# Patient Record
Sex: Female | Born: 1970 | Race: White | Hispanic: No | Marital: Single | Smoking: Current every day smoker
Health system: Southern US, Community
[De-identification: ages and names within clinical notes are randomized; demographics above are authoritative.]

## PROBLEM LIST (undated history)

## (undated) DIAGNOSIS — E119 Type 2 diabetes mellitus without complications: Secondary | ICD-10-CM

## (undated) DIAGNOSIS — T7840XA Allergy, unspecified, initial encounter: Secondary | ICD-10-CM

## (undated) DIAGNOSIS — I10 Essential (primary) hypertension: Secondary | ICD-10-CM

## (undated) DIAGNOSIS — E785 Hyperlipidemia, unspecified: Secondary | ICD-10-CM

## (undated) HISTORY — PX: WISDOM TOOTH EXTRACTION: SHX21

## (undated) HISTORY — DX: Allergy, unspecified, initial encounter: T78.40XA

## (undated) HISTORY — PX: TONSILLECTOMY: SUR1361

## (undated) HISTORY — DX: Hyperlipidemia, unspecified: E78.5

## (undated) HISTORY — DX: Essential (primary) hypertension: I10

## (undated) HISTORY — DX: Type 2 diabetes mellitus without complications: E11.9

---

## 1999-09-15 ENCOUNTER — Other Ambulatory Visit: Admission: RE | Admit: 1999-09-15 | Discharge: 1999-09-15 | Payer: Self-pay | Admitting: Obstetrics and Gynecology

## 2000-12-01 ENCOUNTER — Other Ambulatory Visit: Admission: RE | Admit: 2000-12-01 | Discharge: 2000-12-01 | Payer: Self-pay | Admitting: Obstetrics and Gynecology

## 2002-01-05 ENCOUNTER — Other Ambulatory Visit: Admission: RE | Admit: 2002-01-05 | Discharge: 2002-01-05 | Payer: Self-pay | Admitting: Obstetrics and Gynecology

## 2003-01-31 ENCOUNTER — Other Ambulatory Visit: Admission: RE | Admit: 2003-01-31 | Discharge: 2003-01-31 | Payer: Self-pay | Admitting: Obstetrics and Gynecology

## 2004-06-02 ENCOUNTER — Ambulatory Visit: Payer: Self-pay | Admitting: Internal Medicine

## 2004-06-06 ENCOUNTER — Other Ambulatory Visit: Admission: RE | Admit: 2004-06-06 | Discharge: 2004-06-06 | Payer: Self-pay | Admitting: Obstetrics and Gynecology

## 2004-07-25 ENCOUNTER — Ambulatory Visit: Payer: Self-pay | Admitting: Internal Medicine

## 2004-09-01 ENCOUNTER — Ambulatory Visit: Payer: Self-pay | Admitting: Internal Medicine

## 2004-11-03 ENCOUNTER — Ambulatory Visit: Payer: Self-pay | Admitting: Internal Medicine

## 2004-12-01 ENCOUNTER — Ambulatory Visit: Payer: Self-pay | Admitting: Internal Medicine

## 2004-12-02 ENCOUNTER — Inpatient Hospital Stay (HOSPITAL_COMMUNITY): Admission: EM | Admit: 2004-12-02 | Discharge: 2004-12-06 | Payer: Self-pay | Admitting: Emergency Medicine

## 2004-12-02 ENCOUNTER — Ambulatory Visit: Payer: Self-pay | Admitting: Internal Medicine

## 2004-12-04 ENCOUNTER — Ambulatory Visit: Payer: Self-pay | Admitting: Gastroenterology

## 2004-12-09 ENCOUNTER — Ambulatory Visit: Payer: Self-pay | Admitting: Internal Medicine

## 2005-01-06 ENCOUNTER — Ambulatory Visit: Payer: Self-pay | Admitting: Internal Medicine

## 2006-10-28 IMAGING — US US RENAL
1 series · 14 of 25 positions shown · non-contrast
Comparison: none

CLINICAL DATA: Acute renal failure

RENAL/URINARY TRACT ULTRASOUND:
TECHNIQUE: Complete ultrasound examination of the urinary tract was performed
including evaluation of the kidneys, renal collecting systems, and urinary
bladder.

[Series 1: unknown · 0.38mm/px · 14 of 33 slices shown]
[im 1/33]
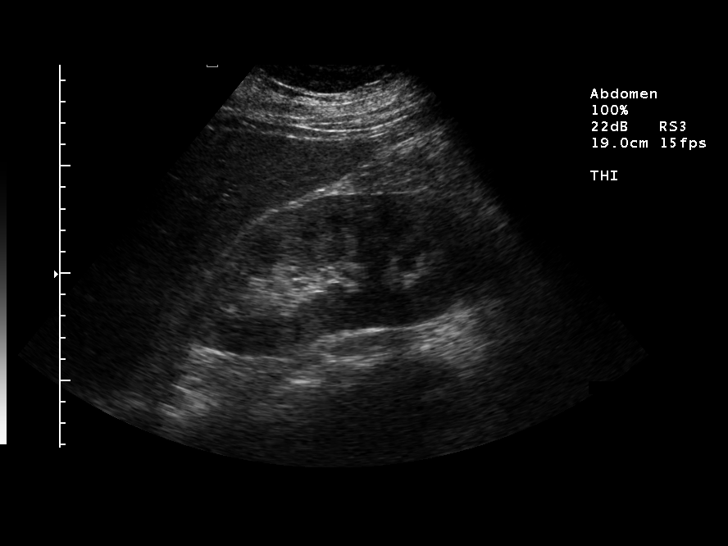
[im 3/33]
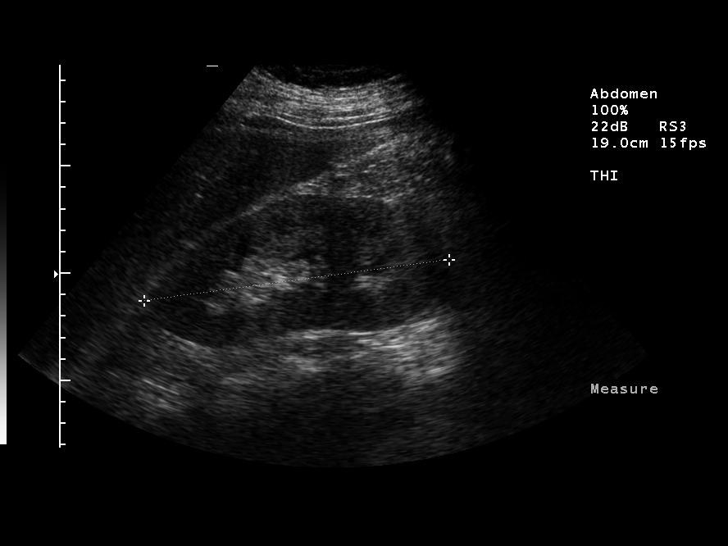
[im 6/33]
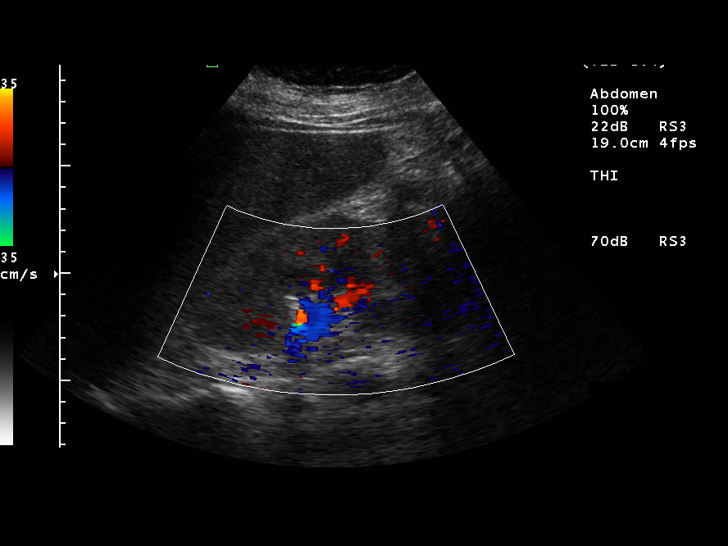
[im 9/33]
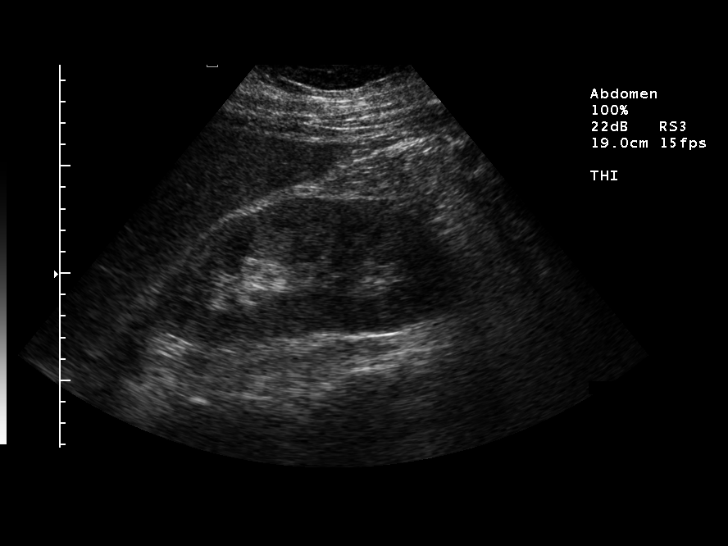
[im 11/33]
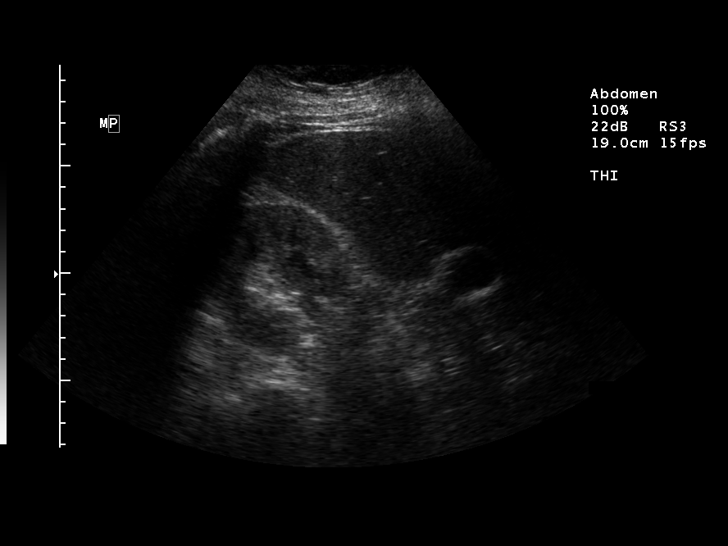
[im 13/33]
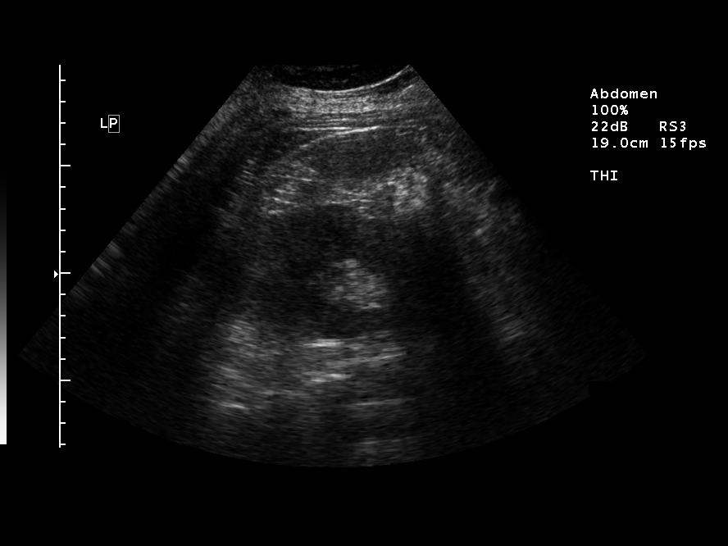
[im 15/33]
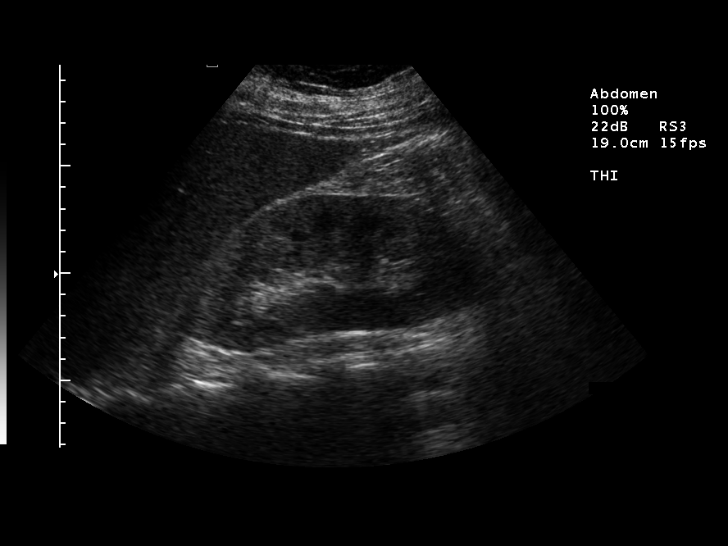
[im 18/33]
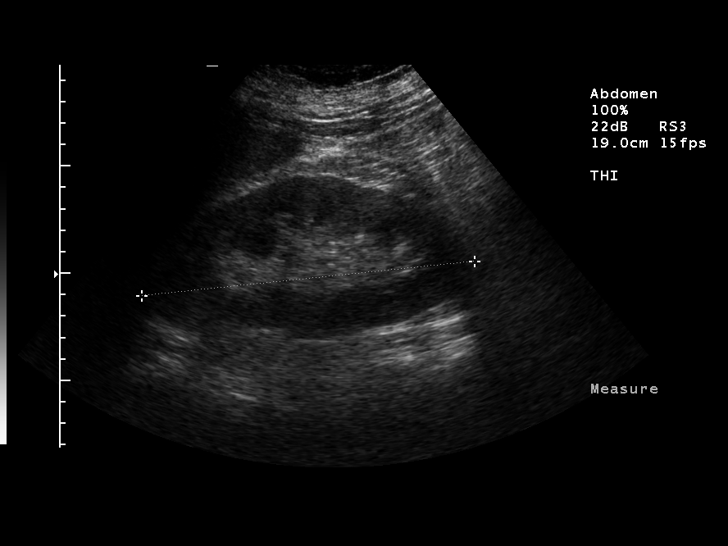
[im 21/33]
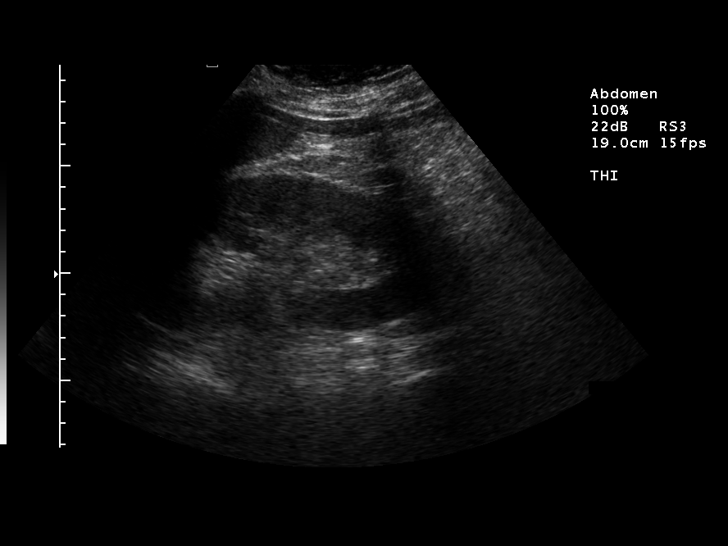
[im 22/33]
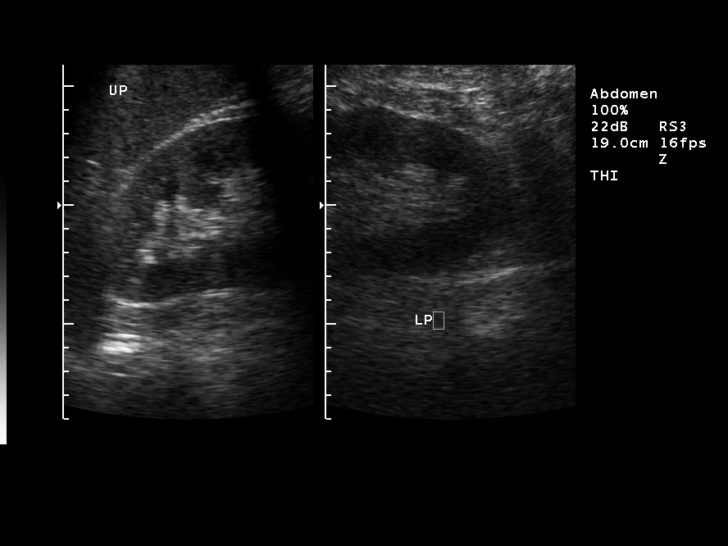
[im 25/33]
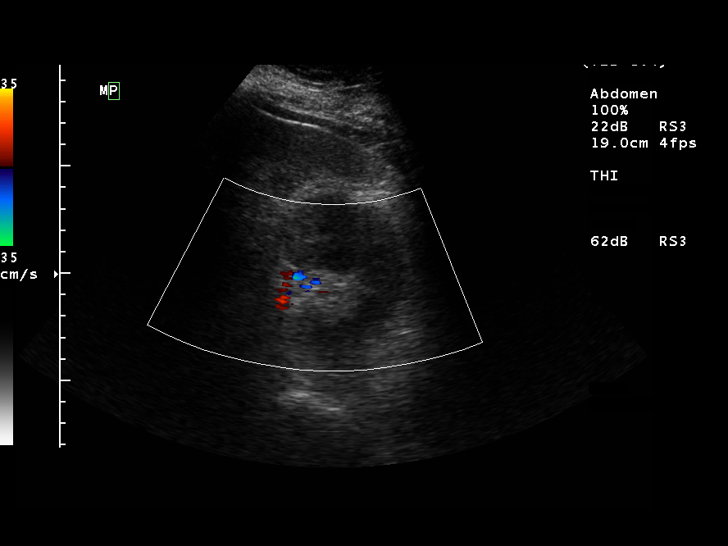
[im 27/33]
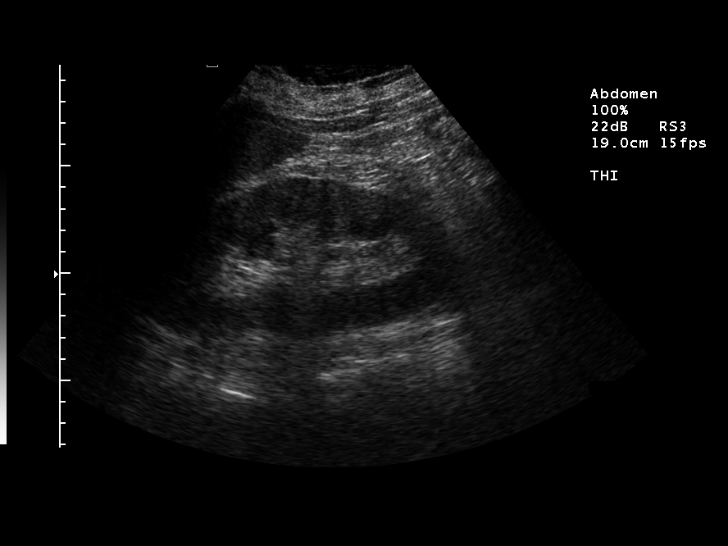
[im 30/33]
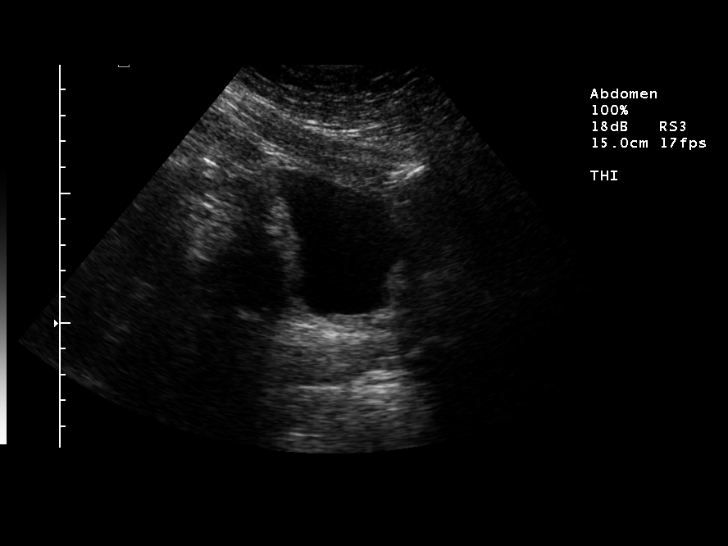
[im 33/33]
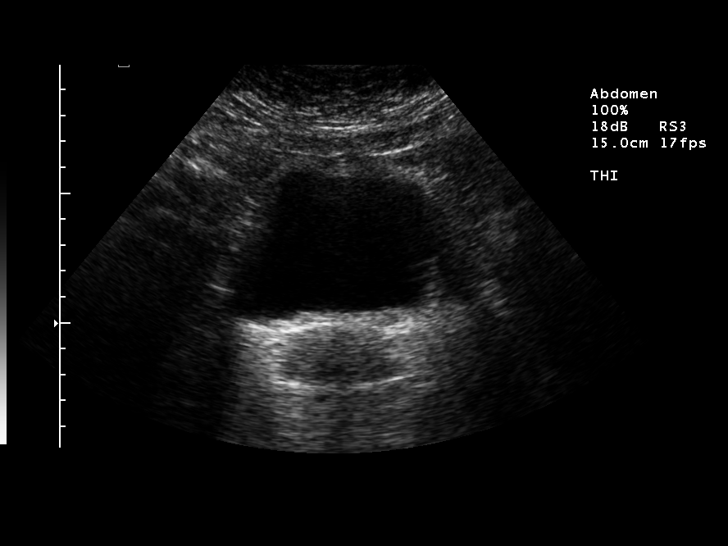

[14 of 25 positions shown; findings below may reference images not displayed]

FINDINGS: Both kidneys are within normal limits in size and parenchymal
echogenicity.  No renal parenchymal lesions are seen.  There is no evidence of
hydronephrosis.  No perinephric abnormalities are seen.

Images of the urinary bladder are unremarkable for the degree of bladder
filling.
IMPRESSION: Normal study.

## 2007-03-22 DIAGNOSIS — R51 Headache: Secondary | ICD-10-CM

## 2007-03-22 DIAGNOSIS — R519 Headache, unspecified: Secondary | ICD-10-CM | POA: Insufficient documentation

## 2007-03-22 DIAGNOSIS — I10 Essential (primary) hypertension: Secondary | ICD-10-CM

## 2008-02-21 ENCOUNTER — Emergency Department (HOSPITAL_COMMUNITY): Admission: EM | Admit: 2008-02-21 | Discharge: 2008-02-21 | Payer: Self-pay | Admitting: Emergency Medicine

## 2010-10-10 NOTE — Discharge Summary (Signed)
[name], [name]             ACCOUNT NO.:  [id number]   MEDICAL RECORD NO.:  [id number]          PATIENT TYPE:  INP   LOCATION:  1615                         FACILITY:  [facility]   PHYSICIAN:  Rene Paci, M.D. LHCDATE OF BIRTH:  1970-12-23   DATE OF ADMISSION:  12/02/2004  DATE OF DISCHARGE:  12/06/2004                                 DISCHARGE SUMMARY   DISCHARGE DIAGNOSES:  1.  Escherichia coli urinary tract infection with sepsis, one of two blood      cultures.  Continue oral antibiotics.  2.  Nausea, vomiting and diarrhea secondary to above, resolved.  Negative      gastrointestinal workup for other pathogens.  3.  Dehydration, secondary to above, improved.  4.  Acute renal failure, secondary to dehydration,  improved.  Discharge      creatinine 2.0  5.  Iron deficiency anemia.  Discharge hemoglobin 10.8.  Continue iron      supplementation by mouth.  6.  Hypokalemia, secondary to gastrointestinal loss, improved.  7.  Headache, possible migraine quality, improved.  8.  History of hypertension holding off on hydrochlorothiazide secondary to      above issues until further evaluation.   DISCHARGE MEDICATIONS:  1.  Cipro 500 mg p.o. b.i.d. x7 additional days to complete 10-day course.  2.  Vicodin 5/500 one to two p.o. q.6h. p.r.n. headache.  3.  Advil over-the-counter as needed for headache.  4.  Iron 325 mg p.o. b.i.d.   DISPOSITION:  The patient is discharged home in medically stable condition.  She is tolerating p.o. nutrition, hydration and medications.  She  understands diagnoses and plans for follow-up.   FOLLOW UP:  Hospital follow-up is to be arranged by the patient for next  week with her primary care physician, Dr. Gordy Mullins, to review  headache symptoms, resolution of renal failure and consider other treatment  for hypertension, if needed.  Also, follow-up on iron deficiency anemia on a  long-term basis.   HOSPITAL COURSE:  Problem 1.  FEVER  WITH NAUSEA, VOMITING AND DIARRHEA:  The  patient is a 40 year old woman who came to the emergency room day of  admission complaining of these GI symptoms associated with weakness and  prostration.  Because of high grade fever of 103.2, significant dehydration  as evidenced by the renal failure, creatinine of 3.3, potassium of 2.6 and a  heart rate in the 150s, she was admitted for IV fluids and hydration.  Although, initially presumed to be related to gastroenteritis, her urine  culture returned with E-coli as did blood cultures returned with 1/2 gram  negative rods, later identified also as the same E-coli.  Because of the  UTI, she was begun on IV antibiotics of Cipro and Flagyl for possible GI  pathogens as well as urinary tract on the second day of her hospitalization  and quickly defervesced.  Her GI symptoms also subsided within the second  day, although a GI consult was obtained to ensure there were no other  underlying GI issues.  No fecal white cells and stool culture showed no  pathogens as  stated above issues.  It is felt that her GI symptoms were  related to underlying E. Coli UTI with sepsis.  The patient now tolerating  p.o. Cipro.  Hydration status markedly improved and feels ready for  discharge home.   Problem 2.  ESCHERICHIA COLI URINARY TRACT INFECTION WITH SEPSIS:  Please  see problem above.   Problem 3.  ACUTE RENAL FAILURE SECONDARY TO DEHYDRATION:  As stated above,  the patient was markedly dehydrated because of GI losses likely also  exacerbated by HCTZ taken prior to admission for hypertension.  She was  aggressively hydrated with slow improvement in her creatinine.  Renal  ultrasound was checked that showed no evidence of hydroobstruction with  normal size kidneys.  With hydration, her creatinine has gradually improved  to a discharge level of 2.0.  Of note, her creatinine was normal at 0.6  earlier this year in March 2006, on routine lab work as an outpatient.   It  is anticipated she will return to this level, although outpatient monitoring  will be undertaken by her primary M.D.   Problem 4.  IRON DEFICIENCY ANEMIA:  With hydration the patient's  hemoglobin.  There was no evidence of acute GI bleeding iron levels were  checked and less than 10.  B12 and folate levels were normal.  As this is  still a menstruating, young woman we have apparent explanation for the iron  deficiency.  The patient has been begun on oral iron at time of discharge  and outpatient follow-up with primary MD will be pursued with further  evaluation by GI for gynecology on an as-needed basis.   Problem 5.  HISTORY OF HYPERTENSION:  The patient has been normotensive  during this hospitalization likely in part related to volume depletion.  Because of underlying issues with dehydration and renal insufficiency, the  patient has been instructed to hold her HCTZ until this time being when she  is reevaluated by primary M.D. to consider resuming this medication or other  antihypertensive on an as-needed basis.       VL/MEDQ  D:  12/06/2004  T:  12/06/2004  Job:  [id number]

## 2010-10-10 NOTE — H&P (Signed)
NAMEVINETTA, BRACH             ACCOUNT NO.:  [id number]   MEDICAL RECORD NO.:  [id number]          PATIENT TYPE:  EMS   LOCATION:  ED                           [facility]AC[facility]   PHYSICIAN:  Corwin Levins, M.D. LHCDATE OF BIRTH:  17-Dec-1970   DATE OF ADMISSION:  12/02/2004  DATE OF DISCHARGE:                                HISTORY & PHYSICAL   CHIEF COMPLAINT:  Persistent nausea, vomiting, fever, and diarrhea for the  last 3-4 days.   HISTORY OF PRESENT ILLNESS:  Ms. Noori is a 40 year old white female with  sudden onset of acute gastroenteritis with vigorous nausea and vomiting and  nonbloody stools, crampy abdominal pain and fever.  She saw Dr. Amador Cunas  on July 10th with severe nausea.  Meds given but overall symptoms worsened  and not controlled.  Now unable to take p.o. today.  Increasing weakness,  frustration.  She comes to the emergency room with dizziness, fever of 103.2  and severe tachycardia with sinus rhythm of approximately 150 beats per  minute.  She is some better with IV fluids in the ER.  Creatinine elevated  at 3.3.  This seems to be a new onset of renal insufficiency and now  admitted for symptomatic control of dehydration and acute renal  insufficiency.   PAST MEDICAL HISTORY:  Hypertension.   PAST SURGICAL HISTORY:  Status post T&A.   ALLERGIES:  No known drug allergies.   CURRENT MEDICATIONS:  1.  BCPs.  2.  HCTZ 25 mg p.o. daily.   SOCIAL HISTORY:  No tobacco.  Occasional alcohol.  Single.  No children.   FAMILY HISTORY:  Diabetes.   REVIEW OF SYSTEMS:  Noncontributory.   PHYSICAL EXAMINATION:  VITAL SIGNS:  Blood pressure 124/81, temperature  103.2, heart rate 153, respiratory rate 15.  GENERAL:  Ms. Grigg is a 40 year old white female.  HEENT:  She appears somewhat flushed.  Sclerae are clear.  Pharynx with mild  erythema.  NECK:  Without lymphadenopathy, JVD, thyromegaly.  LUNGS:  No rales or wheezes.  HEART:  Tachycardic.   Otherwise regular rate and rhythm with no murmur.  ABDOMEN:  Soft and nontender.  Positive bowel sounds except for mild  tenderness in the left upper and left lower quadrant.  There is no guarding  or rebound.  EXTREMITIES:  No edema.  No rash.   LABS:  White blood cell count 8.5, hemoglobin 12.3.  Electrolytes include  sodium 131, potassium 2.6, chloride 97, bicarb 19, BUN 35, creatinine 3.3,  glucose 130.  Urine pregnancy test negative.   ASSESSMENT/PLAN:  1.  Febrile illness with prostration, nausea, vomiting, diarrhea.  She will      be admitted for IV fluids, symptomatic control.  We will check blood and      urine cultures but overall, the patient just seems viral.  Will hold      antibiotics at start unless culture is positive or other symptoms      develop.  2.  Acute renal insufficiency, likely secondary as above.  Treat as above.      Also check renal ultrasound.  Follow BUN and creatinine as above.  3.  Hypokalemia:  Likely secondary to nausea and vomiting and      gastrointestinal disturbance.  We will replace IV.  4.  Hypertension:  Hold HCTZ.  Restart later.       JWJ/MEDQ  D:  12/02/2004  T:  12/02/2004  Job:  [id number]   cc:   Gordy Savers, M.D. [facility]

## 2011-02-23 LAB — URINALYSIS, ROUTINE W REFLEX MICROSCOPIC
Glucose, UA: 100 — AB
Nitrite: POSITIVE — AB
Urobilinogen, UA: 0.2

## 2011-02-23 LAB — URINE MICROSCOPIC-ADD ON

## 2012-08-29 ENCOUNTER — Ambulatory Visit (INDEPENDENT_AMBULATORY_CARE_PROVIDER_SITE_OTHER): Payer: Medicare HMO | Admitting: Internal Medicine

## 2012-08-29 ENCOUNTER — Ambulatory Visit: Payer: Medicare HMO

## 2012-08-29 VITALS — BP 105/79 | HR 101 | Temp 99.0°F | Resp 18 | Ht 68.0 in | Wt 265.0 lb

## 2012-08-29 DIAGNOSIS — J111 Influenza due to unidentified influenza virus with other respiratory manifestations: Secondary | ICD-10-CM

## 2012-08-29 DIAGNOSIS — J101 Influenza due to other identified influenza virus with other respiratory manifestations: Secondary | ICD-10-CM

## 2012-08-29 DIAGNOSIS — R05 Cough: Secondary | ICD-10-CM

## 2012-08-29 LAB — POCT CBC
HCT, POC: 47.3 % (ref 37.7–47.9)
Hemoglobin: 15.2 g/dL (ref 12.2–16.2)
MCH, POC: 28 pg (ref 27–31.2)
MCV: 87.2 fL (ref 80–97)
MID (cbc): 0.3 (ref 0–0.9)
MPV: 9.2 fL (ref 0–99.8)
POC LYMPH PERCENT: 16.5 %L (ref 10–50)
Platelet Count, POC: 194 10*3/uL (ref 142–424)
RBC: 5.43 M/uL (ref 4.04–5.48)
RDW, POC: 14 %

## 2012-08-29 MED ORDER — OSELTAMIVIR PHOSPHATE 75 MG PO CAPS: 75.0000 mg | ORAL_CAPSULE | Freq: Two times a day (BID) | ORAL | Status: DC

## 2012-08-29 MED ORDER — HYDROCODONE-HOMATROPINE 5-1.5 MG/5ML PO SYRP: ORAL_SOLUTION | ORAL | Status: DC

## 2012-08-29 MED ORDER — IPRATROPIUM BROMIDE 0.06 % NA SOLN
2.0000 | Freq: Three times a day (TID) | NASAL | Status: DC
Start: 2012-08-29 — End: 2013-04-06

## 2012-08-29 NOTE — Progress Notes (Signed)
Patient ID: [name] MRN: [id number], DOB: 05-29-1970, 42 y.o. Date of Encounter: 08/29/2012, 9:25 PM  Primary Physician: No primary provider on file.  Chief Complaint: Nasal congestion, sore throat, cough, and chills, for 2 days  HPI: 42 y.o. female with history below presents with nasal congestion, rhinorrhea, sore throat, otalgia, and cough for 2 days. Afebrile, but with chills. Cough is not productive and not associated with time of day. No shortness of breath or wheezing. Ears will pop then hearing will again become muffled again shortly thereafter. Notes some post nasal drip. Multiple sick contacts at work. No influenza vaccine this year. Has tried DayQuil and Aleave. Last dose around 3 PM today. No GI complaints. No sedentary periods, leg trauma, history of cancer, or current tobacco use.     Past Medical History  Diagnosis Date  . Allergy      Home Meds: Prior to Admission medications   Medication Sig Start Date End Date Taking? Authorizing Provider  etonogestrel (IMPLANON) 68 MG IMPL implant Inject 1 each into the skin once.   Yes Historical Provider, MD  losartan (COZAAR) 50 MG tablet Take 25 mg by mouth daily.   Yes Historical Provider, MD  nebivolol (BYSTOLIC) 2.5 MG tablet Take 2.5 mg by mouth daily.   Yes Historical Provider, MD    Allergies: No Known Allergies  History   Social History  . Marital Status: Married    Spouse Name: N/A    Number of Children: N/A  . Years of Education: N/A   Occupational History  . Not on file.   Social History Main Topics  . Smoking status: Former Games developer  . Smokeless tobacco: Not on file  . Alcohol Use: Yes  . Drug Use: No  . Sexually Active: Yes    Birth Control/ Protection: Implant   Other Topics Concern  . Not on file   Social History Narrative  . No narrative on file     Review of Systems: Constitutional: positive for chills and fatigue. negative for fever, night sweats, or weight changes  HEENT: see  above Cardiovascular: negative for chest pain or palpitations Respiratory: positive for cough. negative for hemoptysis, dyspnea, wheezing, shortness of breath Abdominal: negative for abdominal pain, nausea, vomiting, or diarrhea Dermatological: negative for rash Neurologic: positive for headache. negative for dizziness, or syncope   Physical Exam: Blood pressure 105/79, pulse 101, temperature 99 F (37.2 C), temperature source Oral, resp. rate 18, height 5\' 8"  (1.727 m), weight 265 lb (120.203 kg), last menstrual period 08/29/2012, SpO2 99.00%., Body mass index is 40.3 kg/(m^2). Pulse recheck at 82 BMP.  General: Well developed, well nourished, in no acute distress. Head: Normocephalic, atraumatic, eyes without discharge, sclera non-icteric, nares are without discharge. Bilateral auditory canals clear, TM's are without perforation, pearly grey and translucent with reflective cone of light bilaterally. Oral cavity moist, posterior pharynx with post nasal drip. No exudate, erythema, or peritonsillar abscess. Uvula midline.   Neck: Supple. No thyromegaly. Full ROM. No lymphadenopathy.  Lungs: Clear bilaterally to auscultation without wheezes, rales, or rhonchi. Breathing is unlabored. Heart: RRR with S1 S2. No murmurs, rubs, or gallops appreciated. Msk:  Strength and tone normal for age. Extremities/Skin: Warm and dry. No clubbing or cyanosis. No edema. No rashes or suspicious lesions. Neuro: Alert and oriented X 3. Moves all extremities spontaneously. Gait is normal. CNII-XII grossly in tact. Psych:  Responds to questions appropriately with a normal affect.   Labs: Results for orders placed in visit on 08/29/12  POCT CBC      Result Value Range   WBC 4.5 (*) 4.6 - 10.2 K/uL   Lymph, poc 0.7  0.6 - 3.4   POC LYMPH PERCENT 16.5  10 - 50 %L   MID (cbc) 0.3  0 - 0.9   POC MID % 7.5  0 - 12 %M   POC Granulocyte 3.4  2 - 6.9   Granulocyte percent 76.0  37 - 80 %G   RBC 5.43  4.04 - 5.48 M/uL    Hemoglobin 15.2  12.2 - 16.2 g/dL   HCT, POC 16.1  09.6 - 47.9 %   MCV 87.2  80 - 97 fL   MCH, POC 28.0  27 - 31.2 pg   MCHC 32.1  31.8 - 35.4 g/dL   RDW, POC 04.5     Platelet Count, POC 194  142 - 424 K/uL   MPV 9.2  0 - 99.8 fL  POCT INFLUENZA A/B      Result Value Range   Influenza A, POC Positive     Influenza B, POC Negative      CXR: [facility] reading (PRIMARY) by  Dr. Merla Riches. Negative   ASSESSMENT AND PLAN:  42 y.o. female with influenza A and cough -Tamiflu 75 mg 1 po bid #10 no RF -Hycodan #4oz 1 tsp po q 4-6 hours prn cough no RF SED -Atrovent NS 0.06% 2 sprays each nare bid prn #1 no RF -Ibuprofen prn -Rest/fluids -Out of work 2-3 days, may extend if needed -RTC precautions  Signed, Eula Listen, PA-C 08/29/2012 9:25 PM  I have reviewed and agree with documentation. Robert P. Merla Riches, M.D.

## 2013-04-06 ENCOUNTER — Ambulatory Visit (INDEPENDENT_AMBULATORY_CARE_PROVIDER_SITE_OTHER): Payer: Medicare HMO | Admitting: Physician Assistant

## 2013-04-06 VITALS — BP 162/84 | HR 96 | Temp 98.4°F | Resp 17 | Ht 68.0 in | Wt 266.0 lb

## 2013-04-06 DIAGNOSIS — R05 Cough: Secondary | ICD-10-CM

## 2013-04-06 DIAGNOSIS — J019 Acute sinusitis, unspecified: Secondary | ICD-10-CM

## 2013-04-06 DIAGNOSIS — I10 Essential (primary) hypertension: Secondary | ICD-10-CM

## 2013-04-06 DIAGNOSIS — R5381 Other malaise: Secondary | ICD-10-CM

## 2013-04-06 MED ORDER — IPRATROPIUM BROMIDE 0.06 % NA SOLN: 2.0000 | Freq: Three times a day (TID) | NASAL | Status: DC

## 2013-04-06 MED ORDER — HYDROCODONE-HOMATROPINE 5-1.5 MG/5ML PO SYRP: ORAL_SOLUTION | ORAL | Status: DC

## 2013-04-06 MED ORDER — AMOXICILLIN-POT CLAVULANATE 875-125 MG PO TABS: 1.0000 | ORAL_TABLET | Freq: Two times a day (BID) | ORAL | Status: DC

## 2013-04-06 NOTE — Progress Notes (Signed)
Patient ID: NANAMI WHITELAW MRN: [id number], DOB: Jan 11, 1971, 42 y.o. Date of Encounter: 04/06/2013, 3:52 PM  Primary Physician: No primary provider on file.  Chief Complaint:  Chief Complaint  Patient presents with  . Cough  . Sinusitis    HPI: 42 y.o. female presents with 7 day history of nasal congestion, post nasal drip, sore throat, sinus pressure, and cough. Her above symptoms worsened 1 day ago. Afebrile. No chills. Nasal congestion thick and green/yellow. Sinus pressure is the worst symptom. Cough is productive secondary to post nasal drip and not associated with time of day. No SOB or wheezing. Ears feel full, leading to sensation of muffled hearing. Has tried OTC cold preps without success. No GI complaints. Appetite normal. No recent antibiotics, recent travels, or sick contacts. No leg trauma, sedentary periods, h/o cancer, or tobacco use.   She was previously on losartan and Bystolic but stopped those several months ago. She did see her PCP at [facility] last week and was restarted on both of these however they have not yet come in through the mail. She states her BP is improved today from her OV at [place]. She has a BP cuff at home and will monitor this as she starts her medication. She will follow up with her PCP in 6 months time. She does not believe that she has been taking any pseudofed containing products.    Past Medical History  Diagnosis Date  . Allergy      Home Meds: Prior to Admission medications   Medication Sig Start Date End Date Taking? Authorizing Provider  etonogestrel (IMPLANON) 68 MG IMPL implant Inject 1 each into the skin once.   Yes Historical Provider, MD  losartan (COZAAR) 50 MG tablet Take 25 mg by mouth daily.   Yes Historical Provider, MD  nebivolol (BYSTOLIC) 2.5 MG tablet Take 2.5 mg by mouth daily.   Yes Historical Provider, MD                         Allergies: No Known Allergies  History   Social History  . Marital Status: Married    Spouse Name: N/A    Number of Children: N/A  . Years of Education: N/A   Occupational History  . Not on file.   Social History Main Topics  . Smoking status: Former Games developer  . Smokeless tobacco: Not on file  . Alcohol Use: Yes  . Drug Use: No  . Sexual Activity: Yes    Birth Control/ Protection: Implant   Other Topics Concern  . Not on file   Social History Narrative  . No narrative on file     Review of Systems: Constitutional: Positive for fatigue. Negative for chills or fever HEENT: see above Cardiovascular: negative for chest pain or palpitations Respiratory: Positive for cough. Negative for wheezing, or shortness of breath Abdominal: negative for abdominal pain, nausea, vomiting or diarrhea Dermatological: negative for rash Neurologic: Positive for headache. Negative for dizziness or vertigo   Physical Exam: Blood pressure 162/84, pulse 96, temperature 98.4 F (36.9 C), temperature source Oral, resp. rate 17, height 5\' 8"  (1.727 m), weight 266 lb (120.657 kg), last menstrual period 04/06/2013, SpO2 99.00%., Body mass index is 40.45 kg/(m^2). General: Well developed, well nourished, in no acute distress. Head: Normocephalic, atraumatic, eyes without discharge, sclera non-icteric, nares are congested. Bilateral auditory canals clear, TM's are without perforation, pearly grey with reflective cone of light bilaterally. Serous effusion bilaterally behind TM's.  Palpation of the sinuses relieves pressure. Oral cavity moist, dentition normal. Posterior pharynx with post nasal drip and mild erythema. No peritonsillar abscess or tonsillar exudate. Uvula midline.  Neck: Supple. No thyromegaly. Full ROM. No lymphadenopathy. Lungs: Clear bilaterally to auscultation without wheezes, rales, or rhonchi. Breathing is unlabored.  Heart: RRR with S1 S2. No murmurs, rubs, or gallops appreciated. Msk:  Strength and tone normal for age. Extremities: No clubbing or cyanosis. No edema. Neuro:  Alert and oriented X 3. Moves all extremities spontaneously. CNII-XII grossly in tact. Psych:  Responds to questions appropriately with a normal affect.     ASSESSMENT AND PLAN:  42 y.o. female with sinusitis, cough secondary to post nasal drip, and hypertension  1) Sinusitis and cough secondary to post nasal drip -Augmentin 875/125 mg 1 po bid #20 no RF -Atrovent NS 0.06% 2 sprays each nare bid prn #1 no RF -Hycodan #4oz 1 tsp po q 4-6 hours prn cough no RF SED -Mucinex -Tylenol/Motrin prn -Rest/fluids -RTC precautions -RTC 3-5 days if no improvement  2) Hypertension  -Managed by Deboraha Sprang -Awaiting medications from mail order -Advised patient to follow BP at home with her cuff -Follow up with PCP as directed, sooner if needed  Signed, Eula Listen, PA-C [facility] [place], [place] [address] [phone] 04/06/2013 3:52 PM

## 2013-08-22 ENCOUNTER — Ambulatory Visit (INDEPENDENT_AMBULATORY_CARE_PROVIDER_SITE_OTHER): Payer: Medicare HMO | Admitting: Family Medicine

## 2013-08-22 VITALS — BP 118/76 | HR 101 | Temp 98.1°F | Resp 20 | Ht 69.0 in | Wt 260.4 lb

## 2013-08-22 DIAGNOSIS — B349 Viral infection, unspecified: Secondary | ICD-10-CM

## 2013-08-22 DIAGNOSIS — B9789 Other viral agents as the cause of diseases classified elsewhere: Secondary | ICD-10-CM

## 2013-08-22 DIAGNOSIS — R110 Nausea: Secondary | ICD-10-CM

## 2013-08-22 MED ORDER — ONDANSETRON 8 MG PO TBDP: 8.0000 mg | ORAL_TABLET | Freq: Three times a day (TID) | ORAL | Status: DC | PRN

## 2013-08-22 NOTE — Progress Notes (Signed)
   Subjective:    Patient ID: [name], female    DOB: [birth year], 43 y.o.   MRN: [id number]  HPI Patient reports today with 4 day history of aches, fever, sinus congestion. Very little nasal drainage or post nasal drainage. Started to feel a little bit better yesterday, but awoke today at 4 am with 103 fever. Took 2 Alleve with resolution of fever. Able to tolerate Alleve without vomiting. Felt like it came out of nowhere. Has felt nauseous all day every day since this started. No vomiting. Able to keep water down without difficulty. Consuming 6-8 glasses per day. No flu vaccine for this season.   Review of Systems Headache on Friday and Saturday on right side, no ear pain, no cough, no dysuria, no frequency, no hematuria. No wheezing, no shortness of breath, non chest pain.    Objective:   Physical Exam  Vitals reviewed. Constitutional: She is oriented to person, place, and time. She appears well-developed and well-nourished. No distress.  HENT:  Head: Normocephalic and atraumatic.  Right Ear: Tympanic membrane, external ear and ear canal normal.  Left Ear: Tympanic membrane, external ear and ear canal normal.  Nose: Nose normal.  Mouth/Throat: Oropharynx is clear and moist.  Eyes: Conjunctivae are normal. Right eye exhibits no discharge. Left eye exhibits no discharge.  Neck: Normal range of motion. Neck supple.  Cardiovascular: Normal rate, regular rhythm and normal heart sounds.   Pulmonary/Chest: Effort normal and breath sounds normal.  Musculoskeletal: Normal range of motion.  Lymphadenopathy:    She has no cervical adenopathy.  Neurological: She is alert and oriented to person, place, and time.  Skin: Skin is warm and dry. She is not diaphoretic.  Psychiatric: She has a normal mood and affect. Her behavior is normal. Judgment and thought content normal.       Assessment & Plan:  1. Viral syndrome -suspect influenza -Written and verbal information provided for  symptomatic treatment. -Follow up if worsening symptoms or now improvement in 3-4 days. -Note provided for patient to be out of work through 08/24/13. Can extend if patient continues to be febrile. Should not go to work until 24 hours of no temperature over 101. 2. Nausea alone - ondansetron (ZOFRAN-ODT) 8 MG disintegrating tablet; Take 1 tablet (8 mg total) by mouth every 8 (eight) hours as needed for nausea.  Dispense: 30 tablet; Refill: 0   Patricia Belfasteborah B. [name], FNP-BC  [facility]  08/22/2013 9:58 AM   Discussed with Ms. Leone PayorGessner, NP agree with above.  Eula Listenyan Dunn, MHS, PA-C [facility] [address] [place], [place] [address] [phone] [facility] 08/25/2013 7:26 PM

## 2013-08-22 NOTE — Patient Instructions (Signed)

## 2013-08-28 ENCOUNTER — Encounter: Payer: Self-pay | Admitting: *Deleted

## 2013-08-29 ENCOUNTER — Ambulatory Visit (INDEPENDENT_AMBULATORY_CARE_PROVIDER_SITE_OTHER): Payer: Medicare HMO | Admitting: Family Medicine

## 2013-08-29 VITALS — BP 132/88 | HR 80 | Temp 99.1°F | Resp 18 | Ht 67.5 in | Wt 254.4 lb

## 2013-08-29 DIAGNOSIS — J3489 Other specified disorders of nose and nasal sinuses: Secondary | ICD-10-CM

## 2013-08-29 DIAGNOSIS — M545 Low back pain, unspecified: Secondary | ICD-10-CM

## 2013-08-29 DIAGNOSIS — N390 Urinary tract infection, site not specified: Secondary | ICD-10-CM

## 2013-08-29 DIAGNOSIS — Z87440 Personal history of urinary (tract) infections: Secondary | ICD-10-CM

## 2013-08-29 DIAGNOSIS — J309 Allergic rhinitis, unspecified: Secondary | ICD-10-CM

## 2013-08-29 DIAGNOSIS — R509 Fever, unspecified: Secondary | ICD-10-CM

## 2013-08-29 LAB — POCT URINALYSIS DIPSTICK
Bilirubin, UA: NEGATIVE
Glucose, UA: NEGATIVE
Ketones, UA: NEGATIVE
Nitrite, UA: POSITIVE
Protein, UA: 100
Spec Grav, UA: 1.015
Urobilinogen, UA: 0.2
pH, UA: 6

## 2013-08-29 LAB — POCT UA - MICROSCOPIC ONLY
Casts, Ur, LPF, POC: NEGATIVE
Crystals, Ur, HPF, POC: NEGATIVE
Mucus, UA: NEGATIVE
Yeast, UA: NEGATIVE

## 2013-08-29 MED ORDER — FLUTICASONE PROPIONATE 50 MCG/ACT NA SUSP: 2.0000 | Freq: Every day | NASAL | Status: DC

## 2013-08-29 MED ORDER — LEVOFLOXACIN 500 MG PO TABS: 500.0000 mg | ORAL_TABLET | Freq: Every day | ORAL | Status: DC

## 2013-08-29 NOTE — Progress Notes (Addendum)
Subjective:  This chart was scribed for Shade Flood, MD by Leone Payor, ED Scribe. This patient was seen in room 3 and the patient's care was started 10:32 AM.  Authored by Silas Sacramento. MD - unable to change in [facility].    Patient ID: [name], female    DOB: August 04, 1970, 43 y.o.   MRN: [id number]  HPI HPI Comments: [name] is a 43 y.o. female who presents to [facility] complaining of constant, unchanged, non-radiating left sided lower back pain that began 2 days ago. She has associated low grade fever and chills at night. She denies recent falls or injuries. She denies any new activities or exercise. She denies dysuria, hematuria, urinary urgency or frequency. She denies bowel or bladder dysfunction or saddle anesthesia.  She denies history of kidney stones.   She reports possibly having the flu or viral URI last week and states those symptoms improved about 3-4 days ago. She states she began to feel worse 2 days ago and states she began taking Dayquil for sinus congestion and pressure. She denies recent nausea, vomiting, cough, she denies yellow/green nasal drainage.   Patient Active Problem List   Diagnosis Date Noted   HYPERTENSION 03/22/2007   HEADACHE 03/22/2007   Past Medical History  Diagnosis Date   Allergy     seasonal   Hyperlipidemia    Hypertension    History reviewed. No pertinent past surgical history. No Known Allergies Prior to Admission medications   Medication Sig Start Date End Date Taking? Authorizing Provider  etonogestrel (IMPLANON) 68 MG IMPL implant Inject 1 each into the skin once.   Yes Historical Provider, MD  losartan (COZAAR) 50 MG tablet Take 25 mg by mouth daily.   Yes Historical Provider, MD  nebivolol (BYSTOLIC) 2.5 MG tablet Take 2.5 mg by mouth daily.   Yes Historical Provider, MD  ondansetron (ZOFRAN-ODT) 8 MG disintegrating tablet Take 1 tablet (8 mg total) by mouth every 8 (eight) hours as needed for nausea. 08/22/13   Emi Belfast, NP   History   Social History   Marital Status: Married    Spouse Name: N/A    Number of Children: N/A   Years of Education: N/A   Occupational History   Not on file.   Social History Main Topics   Smoking status: Former Smoker    Quit date: 08/23/2003   Smokeless tobacco: Not on file   Alcohol Use: Yes   Drug Use: No   Sexual Activity: Yes    Birth Control/ Protection: Implant   Other Topics Concern   Not on file   Social History Narrative   No narrative on file     Review of Systems  Constitutional: Positive for fever and chills.  HENT: Positive for congestion and sinus pressure. Negative for rhinorrhea.   Respiratory: Negative for cough.   Gastrointestinal: Negative for nausea and vomiting.  Genitourinary: Negative for dysuria, urgency, frequency and hematuria.  Musculoskeletal: Positive for back pain.  Neurological: Negative for numbness.       Objective:   Physical Exam  Vitals reviewed. Constitutional: She is oriented to person, place, and time. She appears well-developed and well-nourished. No distress.  HENT:  Head: Normocephalic and atraumatic.  Right Ear: Hearing, tympanic membrane, external ear and ear canal normal.  Left Ear: Hearing, tympanic membrane, external ear and ear canal normal.  Nose: Mucosal edema present. Right sinus exhibits maxillary sinus tenderness (pressure). Left sinus exhibits maxillary sinus tenderness (pressure).  Mouth/Throat: Oropharynx is clear and moist. No oropharyngeal exudate.  Eyes: Conjunctivae and EOM are normal. Pupils are equal, round, and reactive to light.  Cardiovascular: Normal rate, regular rhythm, normal heart sounds and intact distal pulses.   No murmur heard. Pulmonary/Chest: Effort normal and breath sounds normal. No respiratory distress. She has no wheezes. She has no rhonchi.  Musculoskeletal:  ROM of spine: Guarded with flexion. Lateral flexion is intact and normal. Pain with left  rotation but equal.   Neurological: She is alert and oriented to person, place, and time.  Skin: Skin is warm and dry. No rash noted.  Psychiatric: She has a normal mood and affect. Her behavior is normal.   Results for orders placed in visit on 08/29/13  POCT URINALYSIS DIPSTICK      Result Value Ref Range   Color, UA yellow     Clarity, UA cloudy     Glucose, UA neg     Bilirubin, UA neg     Ketones, UA neg     Spec Grav, UA 1.015     Blood, UA small     pH, UA 6.0     Protein, UA 100     Urobilinogen, UA 0.2     Nitrite, UA positive     Leukocytes, UA small (1+)    POCT UA - MICROSCOPIC ONLY      Result Value Ref Range   WBC, Ur, HPF, POC 7-10     RBC, urine, microscopic 0-1     Bacteria, U Microscopic 4+     Mucus, UA neg     Epithelial cells, urine per micros 0-1     Crystals, Ur, HPF, POC neg     Casts, Ur, LPF, POC neg     Yeast, UA neg       Filed Vitals:   08/29/13 0956  BP: 132/88  Pulse: 80  Temp: 99.1 F (37.3 C)  TempSrc: Oral  Resp: 18  Height: 5' 7.5" (1.715 m)  Weight: 254 lb 6.4 oz (115.395 kg)  SpO2: 99%        Assessment & Plan:   [name] is a 43 y.o. female Low back pain - Plan: POCT urinalysis dipstick, POCT UA - Microscopic Only, levofloxacin (LEVAQUIN) 500 MG tablet, CANCELED: POCT UA - Microscopic Only  Fever, unspecified - Plan: POCT urinalysis dipstick, POCT UA - Microscopic Only, CANCELED: POCT UA - Microscopic Only  Hx: UTI (urinary tract infection) - Plan: POCT urinalysis dipstick, POCT UA - Microscopic Only, CANCELED: POCT UA - Microscopic Only  UTI (urinary tract infection) - Plan: Urine culture, levofloxacin (LEVAQUIN) 500 MG tablet  Sinus pressure - Plan: fluticasone (FLONASE) 50 MCG/ACT nasal spray  Allergic rhinitis - Plan: fluticasone (FLONASE) 50 MCG/ACT nasal spray  Sinus pressure - AR likely. Doubt sinusitis.  Start antihistamine and Flonase.  rtc precautions.   LBP, with signs of UTI.  Low grade fever.  Pain appears to be lower than CVAT typically and reproduced with mvmt - more likely MSK source of pain - sx care as below, but will start Levaquin for possible early Pyelo, urine culture. Fluids and rtc precautions.    Meds ordered this encounter  Medications   fluticasone (FLONASE) 50 MCG/ACT nasal spray    Sig: Place 2 sprays into both nostrils daily.    Dispense:  16 g    Refill:  6   levofloxacin (LEVAQUIN) 500 MG tablet    Sig: Take 1 tablet (500 mg total) by  mouth daily.    Dispense:  7 tablet    Refill:  0   Patient Instructions  Start Zyrtec OR Allegra and Flonase for likely allergies. If sinuses not improved in next 1 week, consider other treatment but unlikely sinus infection at this point, and antibiotic for urine infection also helps in sinuses. Tylenol or motrin, heat or ice and back care manual treatments/exercises for low back pain. We will start antibiotic for possible infection and check a urine culture to make sure this is infection.  If not improving in next 3-4 days,  return for recheck.  You should receive a call or letter about your lab results within the next week to 10 days.    Return to the clinic or go to the nearest emergency room if any of your symptoms worsen or new symptoms occur.  Urinary Tract Infection Urinary tract infections (UTIs) can develop anywhere along your urinary tract. Your urinary tract is your body's drainage system for removing wastes and extra water. Your urinary tract includes two kidneys, two ureters, a bladder, and a urethra. Your kidneys are a pair of bean-shaped organs. Each kidney is about the size of your fist. They are located below your ribs, one on each side of your spine. CAUSES Infections are caused by microbes, which are microscopic organisms, including fungi, viruses, and bacteria. These organisms are so small that they can only be seen through a microscope. Bacteria are the microbes that most commonly cause UTIs. SYMPTOMS    Symptoms of UTIs may vary by age and gender of the patient and by the location of the infection. Symptoms in young women typically include a frequent and intense urge to urinate and a painful, burning feeling in the bladder or urethra during urination. Older women and men are more likely to be tired, shaky, and weak and have muscle aches and abdominal pain. A fever may mean the infection is in your kidneys. Other symptoms of a kidney infection include pain in your back or sides below the ribs, nausea, and vomiting. DIAGNOSIS To diagnose a UTI, your caregiver will ask you about your symptoms. Your caregiver also will ask to provide a urine sample. The urine sample will be tested for bacteria and white blood cells. White blood cells are made by your body to help fight infection. TREATMENT  Typically, UTIs can be treated with medication. Because most UTIs are caused by a bacterial infection, they usually can be treated with the use of antibiotics. The choice of antibiotic and length of treatment depend on your symptoms and the type of bacteria causing your infection. HOME CARE INSTRUCTIONS  If you were prescribed antibiotics, take them exactly as your caregiver instructs you. Finish the medication even if you feel better after you have only taken some of the medication.  Drink enough water and fluids to keep your urine clear or pale yellow.  Avoid caffeine, tea, and carbonated beverages. They tend to irritate your bladder.  Empty your bladder often. Avoid holding urine for long periods of time.  Empty your bladder before and after sexual intercourse.  After a bowel movement, women should cleanse from front to back. Use each tissue only once. SEEK MEDICAL CARE IF:   You have back pain.  You develop a fever.  Your symptoms do not begin to resolve within 3 days. SEEK IMMEDIATE MEDICAL CARE IF:   You have severe back pain or lower abdominal pain.  You develop chills.  You have nausea or  vomiting.  You have continued burning or discomfort with urination. MAKE SURE YOU:   Understand these instructions.  Will watch your condition.  Will get help right away if you are not doing well or get worse. Document Released: 02/18/2005 Document Revised: 11/10/2011 Document Reviewed: 06/19/2011 [facility] Patient Information 2014 [place], [place].  Back Pain, Adult Low back pain is very common. About 1 in 5 people have back pain.The cause of low back pain is rarely dangerous. The pain often gets better over time.About half of people with a sudden onset of back pain feel better in just 2 weeks. About 8 in 10 people feel better by 6 weeks.  CAUSES Some common causes of back pain include:  Strain of the muscles or ligaments supporting the spine.  Wear and tear (degeneration) of the spinal discs.  Arthritis.  Direct injury to the back. DIAGNOSIS Most of the time, the direct cause of low back pain is not known.However, back pain can be treated effectively even when the exact cause of the pain is unknown.Answering your caregiver's questions about your overall health and symptoms is one of the most accurate ways to make sure the cause of your pain is not dangerous. If your caregiver needs more information, he or she may order lab work or imaging tests (X-rays or MRIs).However, even if imaging tests show changes in your back, this usually does not require surgery. HOME CARE INSTRUCTIONS For many people, back pain returns.Since low back pain is rarely dangerous, it is often a condition that people can learn to [facility] their own.   Remain active. It is stressful on the back to sit or stand in one place. Do not sit, drive, or stand in one place for more than 30 minutes at a time. Take short walks on level surfaces as soon as pain allows.Try to increase the length of time you walk each day.  Do not stay in bed.Resting more than 1 or 2 days can delay your recovery.  Do not avoid  exercise or work.Your body is made to move.It is not dangerous to be active, even though your back may hurt.Your back will likely heal faster if you return to being active before your pain is gone.  Pay attention to your body when you bend and lift. Many people have less discomfortwhen lifting if they bend their knees, keep the load close to their bodies,and avoid twisting. Often, the most comfortable positions are those that put less stress on your recovering back.  Find a comfortable position to sleep. Use a firm mattress and lie on your side with your knees slightly bent. If you lie on your back, put a pillow under your knees.  Only take over-the-counter or prescription medicines as directed by your caregiver. Over-the-counter medicines to reduce pain and inflammation are often the most helpful.Your caregiver may prescribe muscle relaxant drugs.These medicines help dull your pain so you can more quickly return to your normal activities and healthy exercise.  Put ice on the injured area.  Put ice in a plastic bag.  Place a towel between your skin and the bag.  Leave the ice on for 15-20 minutes, 03-04 times a day for the first 2 to 3 days. After that, ice and heat may be alternated to reduce pain and spasms.  Ask your caregiver about trying back exercises and gentle massage. This may be of some benefit.  Avoid feeling anxious or stressed.Stress increases muscle tension and can worsen back pain.It is important to recognize when you are anxious  or stressed and learn ways to manage it.Exercise is a great option. SEEK MEDICAL CARE IF:  You have pain that is not relieved with rest or medicine.  You have pain that does not improve in 1 week.  You have new symptoms.  You are generally not feeling well. SEEK IMMEDIATE MEDICAL CARE IF:   You have pain that radiates from your back into your legs.  You develop new bowel or bladder control problems.  You have unusual weakness or  numbness in your arms or legs.  You develop nausea or vomiting.  You develop abdominal pain.  You feel faint. Document Released: 05/11/2005 Document Revised: 11/10/2011 Document Reviewed: 09/29/2010 [facility] Patient Information 2014 [place], [place].

## 2013-08-29 NOTE — Patient Instructions (Addendum)
Start Zyrtec OR Allegra and Flonase for likely allergies. If sinuses not improved in next 1 week, consider other treatment but unlikely sinus infection at this point, and antibiotic for urine infection also helps in sinuses. Tylenol or motrin, heat or ice and back care manual treatments/exercises for low back pain. We will start antibiotic for possible infection and check a urine culture to make sure this is infection.  If not improving in next 3-4 days,  return for recheck.  You should receive a call or letter about your lab results within the next week to 10 days.    Return to the clinic or go to the nearest emergency room if any of your symptoms worsen or new symptoms occur.  Urinary Tract Infection Urinary tract infections (UTIs) can develop anywhere along your urinary tract. Your urinary tract is your body's drainage system for removing wastes and extra water. Your urinary tract includes two kidneys, two ureters, a bladder, and a urethra. Your kidneys are a pair of bean-shaped organs. Each kidney is about the size of your fist. They are located below your ribs, one on each side of your spine. CAUSES Infections are caused by microbes, which are microscopic organisms, including fungi, viruses, and bacteria. These organisms are so small that they can only be seen through a microscope. Bacteria are the microbes that most commonly cause UTIs. SYMPTOMS  Symptoms of UTIs may vary by age and gender of the patient and by the location of the infection. Symptoms in young women typically include a frequent and intense urge to urinate and a painful, burning feeling in the bladder or urethra during urination. Older women and men are more likely to be tired, shaky, and weak and have muscle aches and abdominal pain. A fever may mean the infection is in your kidneys. Other symptoms of a kidney infection include pain in your back or sides below the ribs, nausea, and vomiting. DIAGNOSIS To diagnose a UTI, your  caregiver will ask you about your symptoms. Your caregiver also will ask to provide a urine sample. The urine sample will be tested for bacteria and white blood cells. White blood cells are made by your body to help fight infection. TREATMENT  Typically, UTIs can be treated with medication. Because most UTIs are caused by a bacterial infection, they usually can be treated with the use of antibiotics. The choice of antibiotic and length of treatment depend on your symptoms and the type of bacteria causing your infection. HOME CARE INSTRUCTIONS  If you were prescribed antibiotics, take them exactly as your caregiver instructs you. Finish the medication even if you feel better after you have only taken some of the medication.  Drink enough water and fluids to keep your urine clear or pale yellow.  Avoid caffeine, tea, and carbonated beverages. They tend to irritate your bladder.  Empty your bladder often. Avoid holding urine for long periods of time.  Empty your bladder before and after sexual intercourse.  After a bowel movement, women should cleanse from front to back. Use each tissue only once. SEEK MEDICAL CARE IF:   You have back pain.  You develop a fever.  Your symptoms do not begin to resolve within 3 days. SEEK IMMEDIATE MEDICAL CARE IF:   You have severe back pain or lower abdominal pain.  You develop chills.  You have nausea or vomiting.  You have continued burning or discomfort with urination. MAKE SURE YOU:   Understand these instructions.  Will watch your condition.  Will get help right away if you are not doing well or get worse. Document Released: 02/18/2005 Document Revised: 11/10/2011 Document Reviewed: 06/19/2011 [facility] Patient Information 2014 [place].  Back Pain, Adult Low back pain is very common. About 1 in 5 people have back pain.The cause of low back pain is rarely dangerous. The pain often gets better over time.About half of people with a  sudden onset of back pain feel better in just 2 weeks. About 8 in 10 people feel better by 6 weeks.  CAUSES Some common causes of back pain include:  Strain of the muscles or ligaments supporting the spine.  Wear and tear (degeneration) of the spinal discs.  Arthritis.  Direct injury to the back. DIAGNOSIS Most of the time, the direct cause of low back pain is not known.However, back pain can be treated effectively even when the exact cause of the pain is unknown.Answering your caregiver's questions about your overall health and symptoms is one of the most accurate ways to make sure the cause of your pain is not dangerous. If your caregiver needs more information, he or she may order lab work or imaging tests (X-rays or MRIs).However, even if imaging tests show changes in your back, this usually does not require surgery. HOME CARE INSTRUCTIONS For many people, back pain returns.Since low back pain is rarely dangerous, it is often a condition that people can learn to [facility] own.   Remain active. It is stressful on the back to sit or stand in one place. Do not sit, drive, or stand in one place for more than 30 minutes at a time. Take short walks on level surfaces as soon as pain allows.Try to increase the length of time you walk each day.  Do not stay in bed.Resting more than 1 or 2 days can delay your recovery.  Do not avoid exercise or work.Your body is made to move.It is not dangerous to be active, even though your back may hurt.Your back will likely heal faster if you return to being active before your pain is gone.  Pay attention to your body when you bend and lift. Many people have less discomfortwhen lifting if they bend their knees, keep the load close to their bodies,and avoid twisting. Often, the most comfortable positions are those that put less stress on your recovering back.  Find a comfortable position to sleep. Use a firm mattress and lie on your side with  your knees slightly bent. If you lie on your back, put a pillow under your knees.  Only take over-the-counter or prescription medicines as directed by your caregiver. Over-the-counter medicines to reduce pain and inflammation are often the most helpful.Your caregiver may prescribe muscle relaxant drugs.These medicines help dull your pain so you can more quickly return to your normal activities and healthy exercise.  Put ice on the injured area.  Put ice in a plastic bag.  Place a towel between your skin and the bag.  Leave the ice on for 15-20 minutes, 03-04 times a day for the first 2 to 3 days. After that, ice and heat may be alternated to reduce pain and spasms.  Ask your caregiver about trying back exercises and gentle massage. This may be of some benefit.  Avoid feeling anxious or stressed.Stress increases muscle tension and can worsen back pain.It is important to recognize when you are anxious or stressed and learn ways to manage it.Exercise is a great option. SEEK MEDICAL CARE IF:  You have pain that  is not relieved with rest or medicine.  You have pain that does not improve in 1 week.  You have new symptoms.  You are generally not feeling well. SEEK IMMEDIATE MEDICAL CARE IF:   You have pain that radiates from your back into your legs.  You develop new bowel or bladder control problems.  You have unusual weakness or numbness in your arms or legs.  You develop nausea or vomiting.  You develop abdominal pain.  You feel faint. Document Released: 05/11/2005 Document Revised: 11/10/2011 Document Reviewed: 09/29/2010 [facility] Patient Information 2014 [place], [place].

## 2013-08-31 LAB — URINE CULTURE

## 2013-09-03 ENCOUNTER — Other Ambulatory Visit: Payer: Self-pay | Admitting: Family Medicine

## 2013-09-03 DIAGNOSIS — N390 Urinary tract infection, site not specified: Secondary | ICD-10-CM

## 2013-09-03 MED ORDER — SULFAMETHOXAZOLE-TMP DS 800-160 MG PO TABS: 1.0000 | ORAL_TABLET | Freq: Two times a day (BID) | ORAL | Status: DC

## 2014-07-24 IMAGING — CR DG CHEST 2V
2 series · 2 of 2 positions shown · non-contrast
Comparison: None.

CLINICAL DATA: Cough.

CHEST - 2 VIEW

[PA]
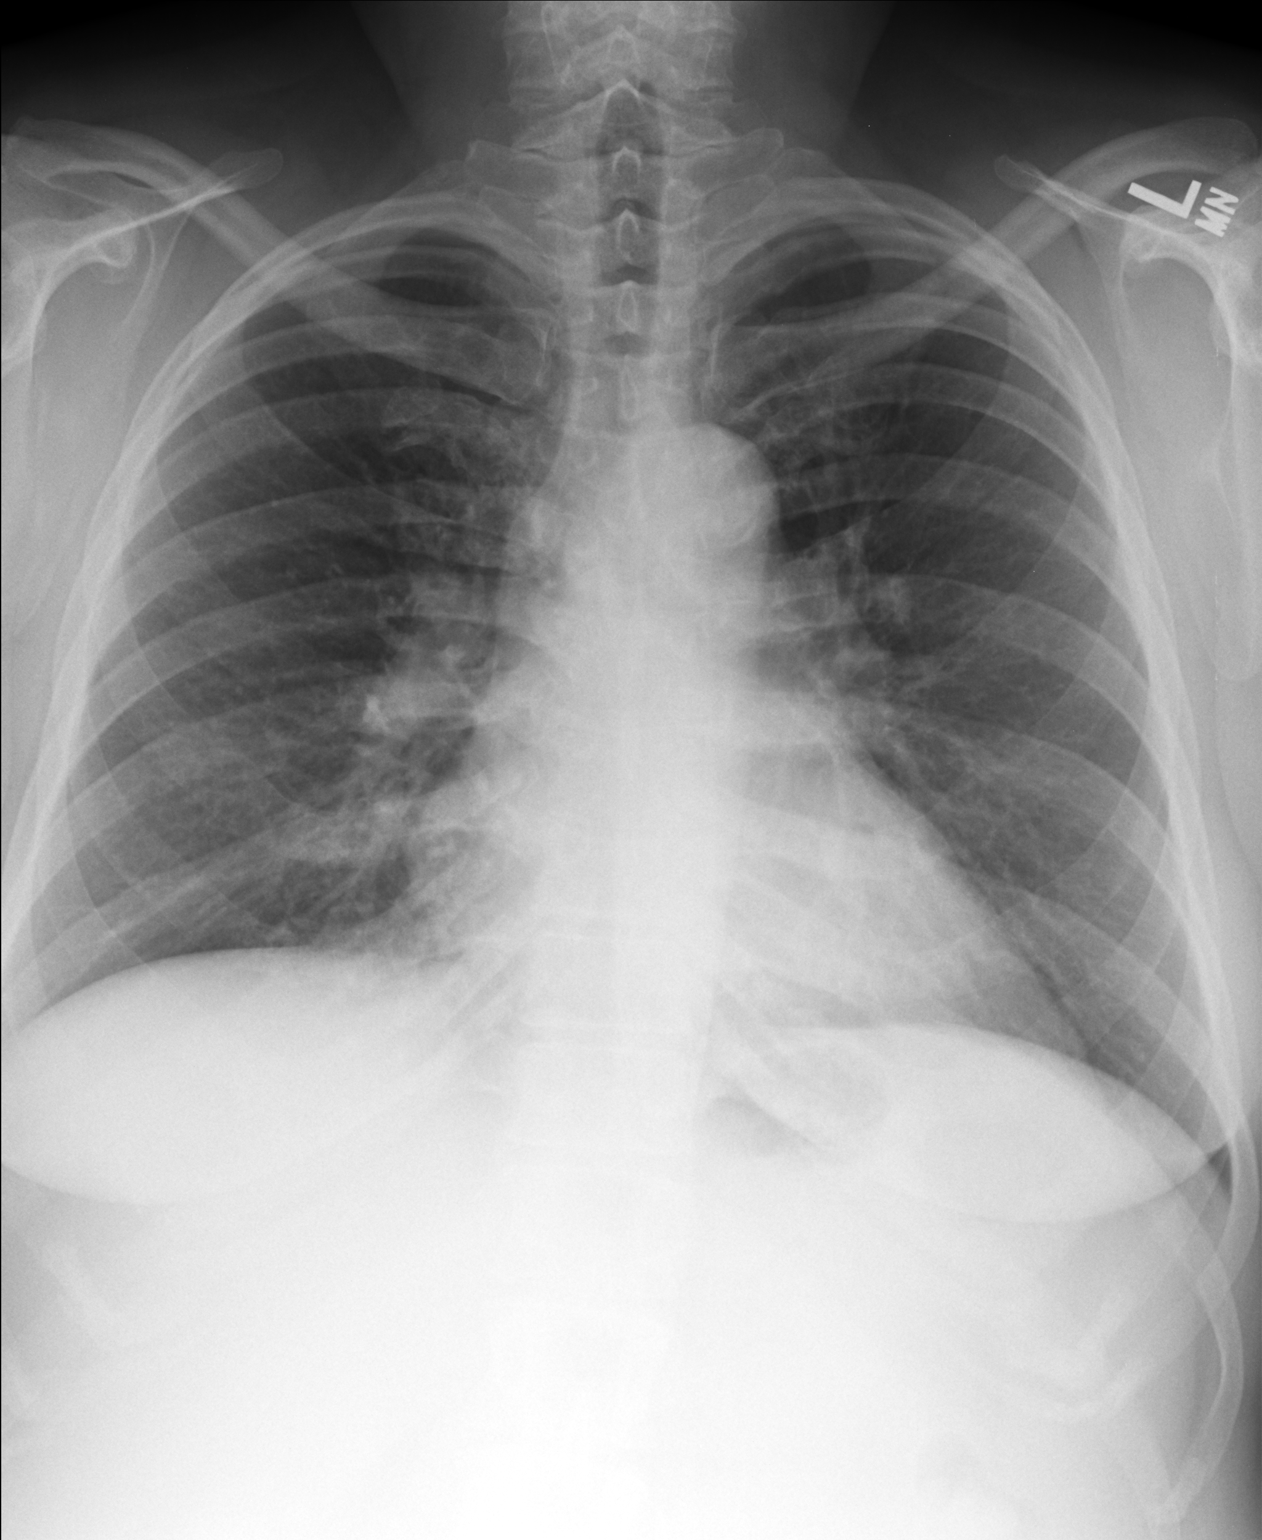

[lateral]
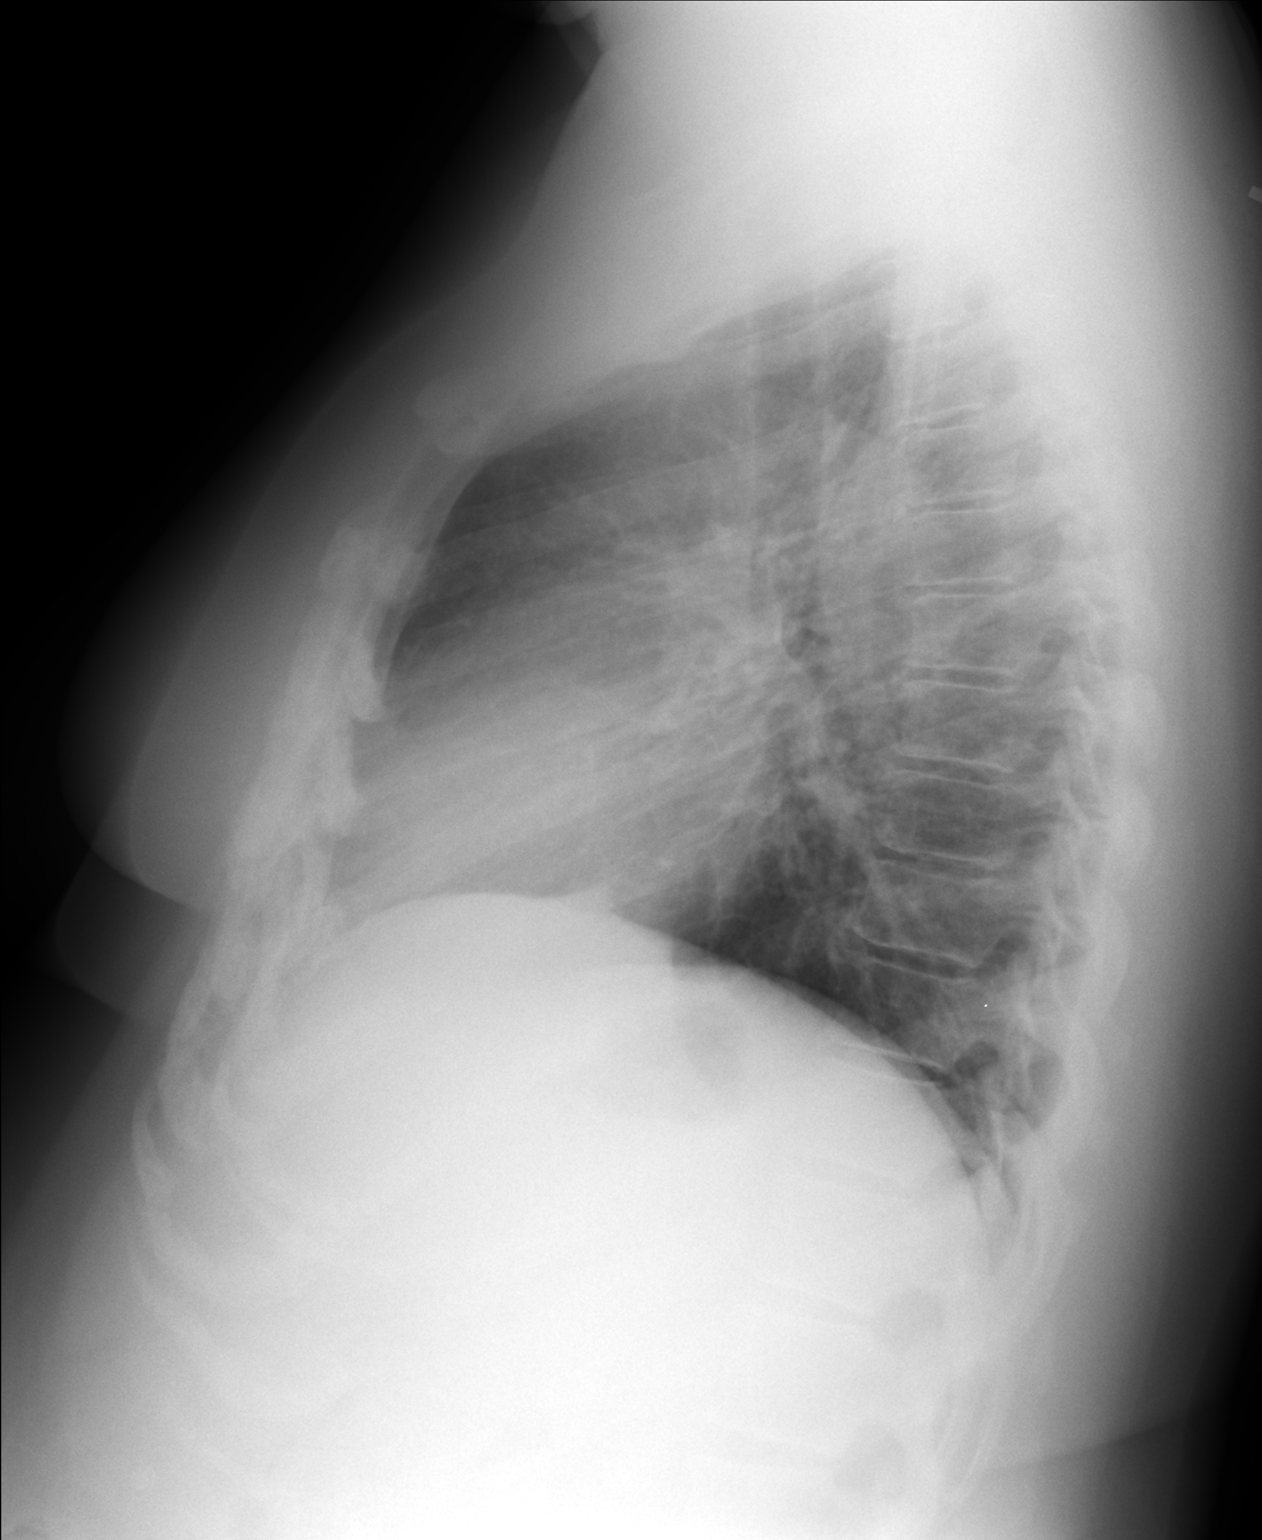

[2 of 2 positions shown; findings below may reference images not displayed]

FINDINGS: The heart size is normal.  The lungs are clear.  The lung
volumes are low.  The visualized soft tissues and bony thorax are
unremarkable.
IMPRESSION: 1.  Low lung volumes.
2.  No acute cardiopulmonary disease.

## 2014-08-28 ENCOUNTER — Ambulatory Visit (INDEPENDENT_AMBULATORY_CARE_PROVIDER_SITE_OTHER): Payer: Managed Care, Other (non HMO) | Admitting: Internal Medicine

## 2014-08-28 VITALS — BP 146/90 | HR 108 | Temp 97.8°F | Resp 20 | Ht 68.5 in | Wt 263.1 lb

## 2014-08-28 DIAGNOSIS — N10 Acute tubulo-interstitial nephritis: Secondary | ICD-10-CM

## 2014-08-28 DIAGNOSIS — R300 Dysuria: Secondary | ICD-10-CM | POA: Diagnosis not present

## 2014-08-28 LAB — POCT URINALYSIS DIPSTICK
Bilirubin, UA: NEGATIVE
GLUCOSE UA: NEGATIVE
Ketones, UA: 80
NITRITE UA: POSITIVE
PROTEIN UA: 100
Spec Grav, UA: 1.025
UROBILINOGEN UA: 1
pH, UA: 6

## 2014-08-28 LAB — POCT UA - MICROSCOPIC ONLY
CASTS, UR, LPF, POC: NEGATIVE
Crystals, Ur, HPF, POC: NEGATIVE
Mucus, UA: NEGATIVE
Yeast, UA: NEGATIVE

## 2014-08-28 MED ORDER — SULFAMETHOXAZOLE-TRIMETHOPRIM 800-160 MG PO TABS: 1.0000 | ORAL_TABLET | Freq: Two times a day (BID) | ORAL | Status: DC

## 2014-08-28 MED ORDER — CEFTRIAXONE SODIUM 1 G IJ SOLR
1.0000 g | Freq: Once | INTRAMUSCULAR | Status: AC
  Administered 2014-08-28: 1 g via INTRAMUSCULAR

## 2014-08-28 NOTE — Progress Notes (Signed)
Subjective:  This chart was scribed for Patricia Pearson, MD by Charline Bills, ED Scribe. The patient was seen in room 2. Patient's care was started at 10:26 AM.   Patient ID: [name], female    DOB: Dec 11, 1970, 44 y.o.   MRN: [id number]  Chief Complaint  Patient presents with  . Urinary Tract Infection    lower back pain, frequency urination, fever for the last 2 days.     HPI  HPI Comments: [name] is a 44 y.o. female, with a h/o HYN, hyperlipidemia, who presents to the [facility] complaining of urinary frequency for the past few days. Pt reports associated fever for the past 2 days, night sweats, generalized body aches, lower back pain, mild urinary urgency. She denies vomiting, diarrhea, abdominal pain, dysuria, vaginal discharge. Pt's last UTI was a year ago. Her LNMP was 3-4 months ago; pt has Implanon. No known allergies. One partner only.  Past Medical History  Diagnosis Date  . Allergy     seasonal  . Hyperlipidemia   . Hypertension    Current Outpatient Prescriptions on File Prior to Visit  Medication Sig Dispense Refill  . etonogestrel (IMPLANON) 68 MG IMPL implant Inject 1 each into the skin once.    . fluticasone (FLONASE) 50 MCG/ACT nasal spray Place 2 sprays into both nostrils daily. 16 g 6  . losartan (COZAAR) 50 MG tablet Take 25 mg by mouth daily.    . nebivolol (BYSTOLIC) 2.5 MG tablet Take 2.5 mg by mouth daily.    . ondansetron (ZOFRAN-ODT) 8 MG disintegrating tablet Take 1 tablet (8 mg total) by mouth every 8 (eight) hours as needed for nausea. 30 tablet 0   No current facility-administered medications on file prior to visit.   No Known Allergies  Review of Systems  Constitutional: Positive for fever and diaphoresis.  Gastrointestinal: Negative for vomiting, abdominal pain and diarrhea.  Genitourinary: Positive for urgency and frequency. Negative for dysuria and vaginal discharge.  Musculoskeletal: Positive for  myalgias and back pain.      Objective:   Physical Exam  Constitutional: She is oriented to person, place, and time. She appears well-developed and well-nourished. No distress.  HENT:  Head: Normocephalic and atraumatic.  Eyes: Conjunctivae and EOM are normal.  Neck: Neck supple. No tracheal deviation present.  Cardiovascular: Normal rate.   Pulmonary/Chest: Effort normal.  Abdominal: There is tenderness (mild with palpation) in the suprapubic area. There is CVA tenderness (mild with percussion).  Musculoskeletal: Normal range of motion.  Neurological: She is alert and oriented to person, place, and time.  Skin: Skin is warm and dry.  Psychiatric: She has a normal mood and affect. Her behavior is normal.  Nursing note and vitals reviewed. BP 146/90 mmHg  Pulse 108  Temp(Src) 97.8 F (36.6 C) (Oral)  Resp 20  Ht 5' 8.5" (1.74 m)  Wt 263 lb 2 oz (119.353 kg)  BMI 39.42 kg/m2  SpO2 98%  LMP 05/29/2014 Results for orders placed or performed in visit on 08/28/14  POCT UA - Microscopic Only  Result Value Ref Range   WBC, Ur, HPF, POC tntc    RBC, urine, microscopic 4-6    Bacteria, U Microscopic 4++    Mucus, UA neg    Epithelial cells, urine per micros 5-10    Crystals, Ur, HPF, POC neg    Casts, Ur, LPF, POC neg    Yeast, UA neg   POCT urinalysis dipstick  Result Value Ref Range   Color, UA dark yellow    Clarity, UA cloudy    Glucose, UA neg    Bilirubin, UA neg    Ketones, UA 80    Spec Grav, UA 1.025    Blood, UA moderate    pH, UA 6.0    Protein, UA 100    Urobilinogen, UA 1.0    Nitrite, UA positive    Leukocytes, UA small (1+)       Assessment & Plan:   I have completed the patient encounter in its entirety as documented by the scribe, with editing by me where necessary. Jesper Stirewalt P. Merla Richesoolittle, M.D. Dysuria - Plan: POCT UA - Microscopic Only, POCT urinalysis dipstick, Urine culture  Acute pyelonephritis - Plan: cefTRIAXone (ROCEPHIN) injection 1 g, Urine  culture, CANCELED: Urine culture  Meds ordered this encounter  Medications  . cefTRIAXone (ROCEPHIN) injection 1 g    Sig:     Order Specific Question:  Antibiotic Indication:    Answer:  UTI  . sulfamethoxazole-trimethoprim (BACTRIM DS,SEPTRA DS) 800-160 MG per tablet    Sig: Take 1 tablet by mouth 2 (two) times daily.    Dispense:  20 tablet    Refill:  0   Notify culture results Follow-up if worse

## 2014-08-30 LAB — URINE CULTURE: Colony Count: >=100000

## 2017-02-15 ENCOUNTER — Ambulatory Visit (INDEPENDENT_AMBULATORY_CARE_PROVIDER_SITE_OTHER): Payer: 59 | Admitting: Physician Assistant

## 2017-02-15 ENCOUNTER — Encounter: Payer: Self-pay | Admitting: Physician Assistant

## 2017-02-15 VITALS — BP 150/80 | HR 107 | Resp 16 | Ht 68.5 in | Wt 267.2 lb

## 2017-02-15 DIAGNOSIS — R030 Elevated blood-pressure reading, without diagnosis of hypertension: Secondary | ICD-10-CM

## 2017-02-15 DIAGNOSIS — S6981XA Other specified injuries of right wrist, hand and finger(s), initial encounter: Secondary | ICD-10-CM | POA: Diagnosis not present

## 2017-02-15 MED ORDER — MELOXICAM 15 MG PO TABS: 15.0000 mg | ORAL_TABLET | Freq: Every day | ORAL | 0 refills | Status: DC

## 2017-02-15 MED ORDER — LOSARTAN POTASSIUM 50 MG PO TABS
50.0000 mg | ORAL_TABLET | Freq: Every day | ORAL | 0 refills | Status: DC
Start: 2017-02-15 — End: 2017-04-07

## 2017-02-15 MED ORDER — HYDROCHLOROTHIAZIDE 25 MG PO TABS: 25.0000 mg | ORAL_TABLET | Freq: Every day | ORAL | 0 refills | Status: DC

## 2017-02-15 NOTE — Patient Instructions (Addendum)
Go back in about 2 months of a BP recheck.     IF you received an x-ray today, you will receive an invoice from [facility]. Please contact [facility] at [phone] with questions or concerns regarding your invoice.   IF you received labwork today, you will receive an invoice from [facility]. Please contact [facility] at [phone] with questions or concerns regarding your invoice.   Our billing staff will not be able to assist you with questions regarding bills from these companies.  You will be contacted with the lab results as soon as they are available. The fastest way to get your results is to activate your My Chart account. Instructions are located on the last page of this paperwork. If you have not heard from [place] regarding the results in 2 weeks, please contact this office.

## 2017-02-15 NOTE — Progress Notes (Signed)
    02/15/2017 2:21 PM   DOB: 1970/08/21 / MRN: [id number]  SUBJECTIVE:  [name] is a 46 y.o. female presenting for right wrist pain that started after using a lopper during gardening yesterday.  She was at this for a couple of hours now. Has tried ASA without relief.  No wrist problems in the past. No strength or sensation changes.   She has No Known Allergies.   She  has a past medical history of Allergy; Hyperlipidemia; and Hypertension.    She  reports that she has been smoking.  She has never used smokeless tobacco. She reports that she drinks alcohol. She reports that she does not use drugs. She  reports that she currently engages in sexual activity. She reports using the following method of birth control/protection: Implant. The patient  has no past surgical history on file.  Her family history includes Diabetes in her maternal grandmother.  Review of Systems  Constitutional: Negative for chills and fever.  Neurological: Negative for dizziness.    The problem list and medications were reviewed and updated by myself where necessary and exist elsewhere in the encounter.   OBJECTIVE:  BP (!) 150/80 (BP Location: Left Arm, Patient Position: Sitting, Cuff Size: Large)   Pulse (!) 107   Resp 16   Ht 5' 8.5" (1.74 m)   Wt 267 lb 3.2 oz (121.2 kg)   SpO2 97%   BMI 40.04 kg/m   BP Readings from Last 3 Encounters:  02/15/17 (!) 150/80  08/28/14 (!) 146/90  08/29/13 132/88    Physical Exam  Constitutional: She is active.  Non-toxic appearance.  Cardiovascular: Normal rate, regular rhythm, S1 normal, S2 normal, normal heart sounds and intact distal pulses.  Exam reveals no gallop, no friction rub and no decreased pulses.   No murmur heard. Pulmonary/Chest: Effort normal. No stridor. No tachypnea. No respiratory distress. She has no wheezes. She has no rales.  Abdominal: She exhibits no distension.  Musculoskeletal: She exhibits edema (travce in the bilateral lower  extremities).       Arms: Neurological: She is alert.  Skin: Skin is warm and dry. She is not diaphoretic. No pallor.    No results found for this or any previous visit (from the past 72 hour(s)).  No results found.  ASSESSMENT AND PLAN:  [name] was seen today for wrist pain.  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  NSAIDs.  Recheck in 3 weeks PRN.  -     meloxicam (MOBIC) 15 MG tablet; Take 1 tablet (15 mg total) by mouth daily. For wrist pain  Elevated blood pressure reading: RTC in 2 months for recheck.  -     losartan (COZAAR) 50 MG tablet; Take 1 tablet (50 mg total) by mouth daily. -     hydrochlorothiazide (HYDRODIURIL) 25 MG tablet; Take 1 tablet (25 mg total) by mouth daily. -     Basic Metabolic Panel    The patient is advised to call or return to clinic if she does not see an improvement in symptoms, or to seek the care of the closest emergency department if she worsens with the above plan.   Deliah Boston, MHS, PA-C Primary Care at [facility] 02/15/2017 2:21 PM

## 2017-02-16 LAB — BASIC METABOLIC PANEL
BUN / CREAT RATIO: 16 (ref 9–23)
BUN: 18 mg/dL (ref 6–24)
CHLORIDE: 97 mmol/L (ref 96–106)
CO2: 22 mmol/L (ref 20–29)
Calcium: 10.5 mg/dL — ABNORMAL HIGH (ref 8.7–10.2)
Creatinine, Ser: 1.14 mg/dL — ABNORMAL HIGH (ref 0.57–1.00)
GFR calc Af Amer: 66 mL/min/{1.73_m2} (ref >59)
GFR calc non Af Amer: 57 mL/min/{1.73_m2} — ABNORMAL LOW (ref >59)
GLUCOSE: 126 mg/dL — AB (ref 65–99)
POTASSIUM: 3.8 mmol/L (ref 3.5–5.2)
SODIUM: 138 mmol/L (ref 134–144)

## 2017-02-19 ENCOUNTER — Telehealth: Payer: 59 | Admitting: Family

## 2017-02-19 DIAGNOSIS — M545 Low back pain, unspecified: Secondary | ICD-10-CM

## 2017-02-19 DIAGNOSIS — R399 Unspecified symptoms and signs involving the genitourinary system: Secondary | ICD-10-CM

## 2017-02-19 DIAGNOSIS — R509 Fever, unspecified: Secondary | ICD-10-CM

## 2017-02-19 NOTE — Progress Notes (Signed)
Based on what you shared with me it looks like you have a serious condition that should be evaluated in a face to face office visit.  NOTE: Even if you have entered your credit card information for this eVisit, you will not be charged.   It looks as if  you may have a UTI or a kidney infection. You need to be seen face to face to evaluate  your symptoms since you are complaining of back pain and a fever.   If you are having a true medical emergency please call 911.  If you need an urgent face to face visit, [facility] has four urgent care centers for your convenience.  If you need care fast and have a high deductible or no insurance consider:   [email or web address]  [phone]  [address] [place], [place] [address] 8 am to 8 pm Monday-Friday 10 am to 4 pm Saturday-Sunday   The following sites will take your  insurance:    . [facility]  [phone] Get Driving Directions Find a Provider at this Location  [address] [place], [place] [address] . 10 am to 8 pm Monday-Friday . 12 pm to 8 pm Saturday-Sunday   . [facility] at [facility]  [phone] Get Driving Directions Find a Provider at this Location  [address] [place], [place] [address] . 8 am to 8 pm Monday-Friday . 9 am to 6 pm Saturday . 11 am to 6 pm Sunday   . [facility] at [facility]  [phone] Get Driving Directions  [address] [place], [place] [address] . 8 am to 8 pm Monday-Friday . 8 am to 4 pm Saturday-Sunday   Your e-visit answers were reviewed by a board certified advanced clinical practitioner to complete your personal care plan.  Thank you for using e-Visits.

## 2017-03-20 ENCOUNTER — Other Ambulatory Visit: Payer: Self-pay | Admitting: Physician Assistant

## 2017-03-20 DIAGNOSIS — S6981XA Other specified injuries of right wrist, hand and finger(s), initial encounter: Secondary | ICD-10-CM

## 2017-04-07 ENCOUNTER — Telehealth: Payer: Self-pay | Admitting: Physician Assistant

## 2017-04-07 ENCOUNTER — Other Ambulatory Visit: Payer: Self-pay

## 2017-04-07 DIAGNOSIS — R030 Elevated blood-pressure reading, without diagnosis of hypertension: Secondary | ICD-10-CM

## 2017-04-07 MED ORDER — LOSARTAN POTASSIUM 50 MG PO TABS: 50.0000 mg | ORAL_TABLET | Freq: Every day | ORAL | 0 refills | Status: DC

## 2017-04-07 MED ORDER — HYDROCHLOROTHIAZIDE 25 MG PO TABS: 25.0000 mg | ORAL_TABLET | Freq: Every day | ORAL | 0 refills | Status: DC

## 2017-04-07 NOTE — Telephone Encounter (Signed)
Copied from CRM [id number]. Topic: Quick Communication - See Telephone Encounter >> Apr 07, 2017 11:36 AM Louie BunPalacios Medina, Rosey Batheresa D wrote: Patient needs refill on her losartan (COZAAR) 50 MG tablet and hydrochlorothiazide (HYDRODIURIL) 25 MG tablet and [facility] [phone] - [place], [place] - [address] [facility] [address]. Patient will be out soon but has a med refill f/u on 04/29/17 with provider and wanted to see if he would send in enough to last her until her next appointment.

## 2017-04-16 ENCOUNTER — Telehealth: Payer: Self-pay | Admitting: Family

## 2017-04-16 DIAGNOSIS — R6889 Other general symptoms and signs: Secondary | ICD-10-CM

## 2017-04-16 MED ORDER — OSELTAMIVIR PHOSPHATE 75 MG PO CAPS: 75.0000 mg | ORAL_CAPSULE | Freq: Two times a day (BID) | ORAL | 0 refills | Status: DC

## 2017-04-16 NOTE — Progress Notes (Signed)

## 2017-04-29 ENCOUNTER — Encounter: Payer: Self-pay | Admitting: Physician Assistant

## 2017-04-29 ENCOUNTER — Ambulatory Visit: Payer: 59 | Admitting: Physician Assistant

## 2017-04-29 ENCOUNTER — Other Ambulatory Visit: Payer: Self-pay

## 2017-04-29 VITALS — BP 132/68 | HR 104 | Temp 98.1°F | Resp 16 | Ht 68.5 in | Wt 252.2 lb

## 2017-04-29 DIAGNOSIS — R5383 Other fatigue: Secondary | ICD-10-CM | POA: Diagnosis not present

## 2017-04-29 DIAGNOSIS — R030 Elevated blood-pressure reading, without diagnosis of hypertension: Secondary | ICD-10-CM

## 2017-04-29 DIAGNOSIS — E119 Type 2 diabetes mellitus without complications: Secondary | ICD-10-CM | POA: Diagnosis not present

## 2017-04-29 DIAGNOSIS — I10 Essential (primary) hypertension: Secondary | ICD-10-CM | POA: Diagnosis not present

## 2017-04-29 LAB — POCT CBC
GRANULOCYTE PERCENT: 78.1 % (ref 37–80)
HCT, POC: 40.8 % (ref 37.7–47.9)
Hemoglobin: 13.9 g/dL (ref 12.2–16.2)
Lymph, poc: 1.6 (ref 0.6–3.4)
MCH: 28.8 pg (ref 27–31.2)
MCHC: 34.2 g/dL (ref 31.8–35.4)
MCV: 84.4 fL (ref 80–97)
MID (cbc): 0.3 (ref 0–0.9)
MPV: 7.4 fL (ref 0–99.8)
PLATELET COUNT, POC: 335 10*3/uL (ref 142–424)
POC Granulocyte: 7 — AB (ref 2–6.9)
POC LYMPH PERCENT: 18.3 %L (ref 10–50)
POC MID %: 3.6 %M (ref 0–12)
RBC: 4.83 M/uL (ref 4.04–5.48)
RDW, POC: 14 %
WBC: 9 10*3/uL (ref 4.6–10.2)

## 2017-04-29 LAB — POCT URINALYSIS DIPSTICK
BILIRUBIN UA: NEGATIVE
NITRITE UA: NEGATIVE
PH UA: 7 (ref 5.0–8.0)
Protein, UA: 30
Spec Grav, UA: 1.015 (ref 1.010–1.025)
Urobilinogen, UA: 0.2 E.U./dL

## 2017-04-29 LAB — POCT GLYCOSYLATED HEMOGLOBIN (HGB A1C): HEMOGLOBIN A1C: 10.3

## 2017-04-29 MED ORDER — LOSARTAN POTASSIUM 50 MG PO TABS: 50.0000 mg | ORAL_TABLET | Freq: Every day | ORAL | 0 refills | Status: DC

## 2017-04-29 MED ORDER — LOSARTAN POTASSIUM 50 MG PO TABS: 50.0000 mg | ORAL_TABLET | Freq: Every day | ORAL | 1 refills | Status: DC

## 2017-04-29 MED ORDER — NAPROXEN 500 MG PO TABS: 500.0000 mg | ORAL_TABLET | Freq: Two times a day (BID) | ORAL | 0 refills | Status: AC

## 2017-04-29 MED ORDER — METFORMIN HCL 1000 MG PO TABS: ORAL_TABLET | ORAL | 1 refills | Status: DC

## 2017-04-29 MED ORDER — HYDROCHLOROTHIAZIDE 25 MG PO TABS: 25.0000 mg | ORAL_TABLET | Freq: Every day | ORAL | 0 refills | Status: DC

## 2017-04-29 MED ORDER — HYDROCHLOROTHIAZIDE 25 MG PO TABS: 25.0000 mg | ORAL_TABLET | Freq: Every day | ORAL | 1 refills | Status: DC

## 2017-04-29 NOTE — Progress Notes (Signed)
04/29/2017 9:13 AM   DOB: 07/23/1970 / MRN: [id number]  SUBJECTIVE:  [name] is a 46 y.o. female presenting for HTN refills and some post flu  symptoms. Took tamiflu. Tells me that she had fever, body aches and nausea. Was seen 4 days later via evisit and received tamiflu.  This did help some.  She also tried tylenol with benadryl.  No cough, nasal congestion.  Some constant generalized HA. Feels that she is no better or worse.   Does have a history of HTN and is medication compliant.  This has been ongoing for a few years now.  Does check randomly and BP runs 120/90.  No history of diabetes.  Does smoke a few cigarrettes a week. No family history of CAD early in life.   Nexplanon in place by Dr. Matthew Saras for [facility].    She has No Known Allergies.   She  has a past medical history of Allergy, Hyperlipidemia, and Hypertension.    She  reports that she has been smoking.  she has never used smokeless tobacco. She reports that she drinks alcohol. She reports that she does not use drugs. She  reports that she currently engages in sexual activity. She reports using the following method of birth control/protection: Implant. The patient  has no past surgical history on file.  Her family history includes Diabetes in her maternal grandmother.  Review of Systems  Constitutional: Negative for chills, diaphoresis and fever.  Respiratory: Negative for cough, hemoptysis, sputum production, shortness of breath and wheezing.   Cardiovascular: Negative for chest pain, orthopnea and leg swelling.  Gastrointestinal: Negative for nausea.  Skin: Negative for rash.  Neurological: Negative for dizziness.    The problem list and medications were reviewed and updated by myself where necessary and exist elsewhere in the encounter.   OBJECTIVE:  BP 132/68 (BP Location: Right Arm, Patient Position: Sitting, Cuff Size: Normal)   Pulse (!) 104   Temp 98.1 F (36.7 C) (Oral)   Resp 16    Ht 5' 8.5" (1.74 m)   Wt 252 lb 3.2 oz (114.4 kg)   SpO2 98%   BMI 37.79 kg/m   Pulse Readings from Last 3 Encounters:  04/29/17 (!) 104  02/15/17 (!) 107  08/28/14 (!) 108   BP Readings from Last 3 Encounters:  04/29/17 132/68  02/15/17 (!) 150/80  08/28/14 (!) 146/90     Physical Exam  Constitutional: She is active.  Non-toxic appearance.  HENT:  Right Ear: Hearing, tympanic membrane, external ear and ear canal normal.  Left Ear: Hearing, tympanic membrane, external ear and ear canal normal.  Nose: Nose normal. Right sinus exhibits no maxillary sinus tenderness and no frontal sinus tenderness. Left sinus exhibits no maxillary sinus tenderness and no frontal sinus tenderness.  Mouth/Throat: Uvula is midline, oropharynx is clear and moist and mucous membranes are normal. Mucous membranes are not dry. No oropharyngeal exudate, posterior oropharyngeal edema or tonsillar abscesses.  Cardiovascular: Normal rate, regular rhythm, S1 normal, S2 normal, normal heart sounds and intact distal pulses. Exam reveals no gallop, no friction rub and no decreased pulses.  No murmur heard. Pulmonary/Chest: Effort normal. No stridor. No tachypnea. No respiratory distress. She has no wheezes. She has no rales.  Abdominal: She exhibits no distension.  Musculoskeletal: She exhibits no edema or tenderness.  Lymphadenopathy:       Head (right side): No submandibular and no tonsillar adenopathy present.       Head (left  side): No submandibular and no tonsillar adenopathy present.    She has no cervical adenopathy.  Neurological: She is alert.  Skin: Skin is warm and dry. She is not diaphoretic. No pallor.    Results for orders placed or performed in visit on 04/29/17 (from the past 72 hour(s))  POCT urinalysis dipstick     Status: Abnormal   Collection Time: 04/29/17  8:39 AM  Result Value Ref Range   Color, UA yellow    Clarity, UA cloudy    Glucose, UA >1,000    Bilirubin, UA negative     Ketones, UA trace    Spec Grav, UA 1.015 1.010 - 1.025   Blood, UA small    pH, UA 7.0 5.0 - 8.0   Protein, UA 30    Urobilinogen, UA 0.2 0.2 or 1.0 E.U./dL   Nitrite, UA negattive    Leukocytes, UA Small (1+) (A) Negative  POCT CBC     Status: Abnormal   Collection Time: 04/29/17  8:40 AM  Result Value Ref Range   WBC 9.0 4.6 - 10.2 K/uL   Lymph, poc 1.6 0.6 - 3.4   POC LYMPH PERCENT 18.3 10 - 50 %L   MID (cbc) 0.3 0 - 0.9   POC MID % 3.6 0 - 12 %M   POC Granulocyte 7.0 (A) 2 - 6.9   Granulocyte percent 78.1 37 - 80 %G   RBC 4.83 4.04 - 5.48 M/uL   Hemoglobin 13.9 12.2 - 16.2 g/dL   HCT, POC 40.8 37.7 - 47.9 %   MCV 84.4 80 - 97 fL   MCH, POC 28.8 27 - 31.2 pg   MCHC 34.2 31.8 - 35.4 g/dL   RDW, POC 14.0 %   Platelet Count, POC 335 142 - 424 K/uL   MPV 7.4 0 - 99.8 fL  POCT glycosylated hemoglobin (Hb A1C)     Status: None   Collection Time: 04/29/17  8:50 AM  Result Value Ref Range   Hemoglobin A1C 10.3     No results found.  ASSESSMENT AND PLAN:  [name] was seen today for illness and medication refill.  Diagnoses and all orders for this visit:  Hypertension, unspecified type -     POCT urinalysis dipstick -     POCT CBC -     CMP14+EGFR -     Cancel: Urine Microscopic  Fatigue, unspecified type -     POCT CBC -     POCT glycosylated hemoglobin (Hb A1C) -     Cancel: Urine Microscopic -     naproxen (NAPROSYN) 500 MG tablet; Take 1 tablet (500 mg total) by mouth 2 (two) times daily with a meal for 10 days. Avoid NSAID medications while taking this.  Elevated blood pressure reading -     Discontinue: hydrochlorothiazide (HYDRODIURIL) 25 MG tablet; Take 1 tablet (25 mg total) by mouth daily. -     Discontinue: losartan (COZAAR) 50 MG tablet; Take 1 tablet (50 mg total) by mouth daily. -     losartan (COZAAR) 50 MG tablet; Take 1 tablet (50 mg total) by mouth daily. -     hydrochlorothiazide (HYDRODIURIL) 25 MG tablet; Take 1 tablet (25 mg total) by mouth  daily.  Type 2 diabetes mellitus without complication, without long-term current use of insulin (HCC) -     metFORMIN (GLUCOPHAGE) 1000 MG tablet; Take a half tab daily for one week in the morning with breakfast.  Add a half tab in the evening  with food after 1 week. -     Microalbumin, urine -     Ambulatory referral to Nutrition and Diabetic Education  Other orders -     Cancel: COMPLETE METABOLIC PANEL WITH GFR -     Cancel: Urinalysis, dipstick only    The patient is advised to call or return to clinic if she does not see an improvement in symptoms, or to seek the care of the closest emergency department if she worsens with the above plan.   Philis Fendt, MHS, PA-C Primary Care at [facility] 04/29/2017 9:13 AM

## 2017-04-29 NOTE — Patient Instructions (Signed)
Keep you phone on so we can call and get you set up with diabetes edu.

## 2017-04-30 LAB — CMP14+EGFR
ALK PHOS: 109 IU/L (ref 39–117)
ALT: 16 IU/L (ref 0–32)
AST: 9 IU/L (ref 0–40)
Albumin/Globulin Ratio: 1.4 (ref 1.2–2.2)
Albumin: 4.3 g/dL (ref 3.5–5.5)
BUN/Creatinine Ratio: 19 (ref 9–23)
BUN: 23 mg/dL (ref 6–24)
Bilirubin Total: 0.3 mg/dL (ref 0.0–1.2)
CALCIUM: 9.9 mg/dL (ref 8.7–10.2)
CO2: 23 mmol/L (ref 20–29)
CREATININE: 1.19 mg/dL — AB (ref 0.57–1.00)
Chloride: 91 mmol/L — ABNORMAL LOW (ref 96–106)
GFR calc Af Amer: 63 mL/min/{1.73_m2} (ref >59)
GFR, EST NON AFRICAN AMERICAN: 54 mL/min/{1.73_m2} — AB (ref >59)
GLOBULIN, TOTAL: 3.1 g/dL (ref 1.5–4.5)
GLUCOSE: 405 mg/dL — AB (ref 65–99)
Potassium: 4.2 mmol/L (ref 3.5–5.2)
SODIUM: 132 mmol/L — AB (ref 134–144)
Total Protein: 7.4 g/dL (ref 6.0–8.5)

## 2017-04-30 LAB — MICROALBUMIN, URINE: Microalbumin, Urine: 61.3 ug/mL

## 2017-04-30 NOTE — Progress Notes (Signed)
FYI: Patient placed on metformin yesterday.  She should have a 2 week follow up in place. Will plan to screen the urine in that time. Deliah BostonMichael Clark, MS, PA-C 9:29 AM, 04/30/2017

## 2017-05-14 ENCOUNTER — Ambulatory Visit: Payer: 59 | Admitting: Physician Assistant

## 2017-05-14 ENCOUNTER — Other Ambulatory Visit: Payer: Self-pay

## 2017-05-14 ENCOUNTER — Encounter: Payer: Self-pay | Admitting: Physician Assistant

## 2017-05-14 VITALS — BP 150/82 | HR 98 | Resp 16 | Ht 68.5 in | Wt 250.0 lb

## 2017-05-14 DIAGNOSIS — E119 Type 2 diabetes mellitus without complications: Secondary | ICD-10-CM

## 2017-05-14 DIAGNOSIS — G478 Other sleep disorders: Secondary | ICD-10-CM | POA: Diagnosis not present

## 2017-05-14 DIAGNOSIS — Z23 Encounter for immunization: Secondary | ICD-10-CM | POA: Diagnosis not present

## 2017-05-14 DIAGNOSIS — R82998 Other abnormal findings in urine: Secondary | ICD-10-CM | POA: Diagnosis not present

## 2017-05-14 LAB — POCT URINALYSIS DIP (MANUAL ENTRY)
BILIRUBIN UA: NEGATIVE
BILIRUBIN UA: NEGATIVE mg/dL
Nitrite, UA: NEGATIVE
SPEC GRAV UA: 1.01 (ref 1.010–1.025)
UROBILINOGEN UA: 0.2 U/dL
pH, UA: 7 (ref 5.0–8.0)

## 2017-05-14 LAB — POC MICROSCOPIC URINALYSIS (UMFC): Mucus: ABSENT

## 2017-05-14 LAB — GLUCOSE, POCT (MANUAL RESULT ENTRY): POC Glucose: 412 mg/dl — AB (ref 70–99)

## 2017-05-14 MED ORDER — METFORMIN HCL 1000 MG PO TABS: ORAL_TABLET | ORAL | 1 refills | Status: DC

## 2017-05-14 NOTE — Progress Notes (Signed)
05/14/2017 9:19 AM   DOB: 1971-01-17 / MRN: [id number]  SUBJECTIVE:  [name] is a 46 y.o. female presenting for recheck of new onset type 2 diabetes last diagnosed about 2 weeks ago. SHe feels well and has been taking metformin.   Tells me that she has not been sleeping well for about 1.5 month, and wakes up multiple times per night with difficulty going back to sleep.  Has tried melatonin and zquil which help some. No history of sleep apnea.  No known snoring.  Is exhausted most days due to waking up.  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   Dep[facility] 05/14/2017  Decreased Interest 0  Down, Depressed, Hopeless 0  PHQ - 2 Score 0       She has No Known Allergies.   She  has a past medical history of Allergy, Hyperlipidemia, and Hypertension.    She  reports that she has been smoking.  she has never used smokeless tobacco. She reports that she drinks alcohol. She reports that she does not use drugs. She  reports that she currently engages in sexual activity. She reports using the following method of birth control/protection: Implant. The patient  has no past surgical history on file.  Her family history includes Diabetes in her maternal grandmother.  Review of Systems  Psychiatric/Behavioral: The patient has insomnia.     The problem list and medications were reviewed and updated by myself where necessary and exist elsewhere in the encounter.   OBJECTIVE:  BP (!) 150/82 (BP Location: Right Arm, Patient Position: Sitting, Cuff Size: Large)   Pulse 98   Resp 16   Ht 5' 8.5" (1.74 m)   Wt 250 lb (113.4 kg)   SpO2 97%   BMI 37.46 kg/m   Physical Exam  Constitutional: She is oriented to person, place, and time. She appears well-developed and well-nourished. She is active. No distress.  HENT:  Mouth/Throat: Oropharynx is clear and moist. No oropharyngeal exudate.  Cardiovascular:  Normal rate, regular rhythm and normal heart sounds.  Pulmonary/Chest: Effort normal and breath sounds normal. No tachypnea.  Musculoskeletal: Normal range of motion.  Neurological: She is alert and oriented to person, place, and time.  Skin: Skin is warm. She is not diaphoretic.    Wt Readings from Last 3 Encounters:  05/14/17 250 lb (113.4 kg)  04/29/17 252 lb 3.2 oz (114.4 kg)  02/15/17 267 lb 3.2 oz (121.2 kg)     Results for orders placed or performed in visit on 05/14/17 (from the past 72 hour(s))  POCT glucose (manual entry)     Status: Abnormal   Collection Time: 05/14/17  8:45 AM  Result Value Ref Range   POC Glucose 412 (A) 70 - 99 mg/dl  POCT Microscopic Urinalysis ([facility])     Status: Abnormal   Collection Time: 05/14/17  8:46 AM  Result Value Ref Range   WBC,UR,HPF,POC Moderate (A) None WBC/hpf   RBC,UR,HPF,POC None None RBC/hpf   Bacteria Moderate (A) None, Too numerous to count   Mucus Absent Absent   Epithelial Cells, UR Per Microscopy Few (A) None, Too numerous to count cells/hpf  POCT urinalysis dipstick     Status: Abnormal   Collection Time: 05/14/17  8:55 AM  Result Value Ref Range   Color, UA yellow yellow   Clarity, UA clear clear   Glucose, UA =500 (A) negative mg/dL  Bilirubin, UA negative negative   Ketones, POC UA negative negative mg/dL   Spec Grav, UA 6.2131.010 0.8651.010 - 1.025   Blood, UA trace-intact (A) negative   pH, UA 7.0 5.0 - 8.0   Protein Ur, POC trace (A) negative mg/dL   Urobilinogen, UA 0.2 0.2 or 1.0 E.U./dL   Nitrite, UA Negative Negative   Leukocytes, UA Small (1+) (A) Negative   No results found for: TSH    ASSESSMENT AND PLAN:  Jozee was seen today for hypertension.  Diagnoses and all orders for this visit:  Type 2 diabetes mellitus without complication, without long-term current use of insulin (HCC): Fasting sugar no greatly improved at this time however this may be in the setting of a UTI.  WIll increase metformin to 2  grams daily.  She will continue to try and lose weight. If urine negative for UTI via culture will plan to add on a small dose of glipizide to achieve glycemic control.  -     POCT glucose (manual entry) -     POCT Microscopic Urinalysis ([facility]) -     Basic Metabolic Panel -     Lipid Panel -     Insulin and C-Peptide -     metFORMIN (GLUCOPHAGE) 1000 MG tablet; Take one tab in the morning and one at night with food. -     Pneumococcal polysaccharide vaccine 23-valent greater than or equal to 2yo subcutaneous/IM  Poor sleep pattern: Take home sleep test ordered via [employer]. Will eval and treat per that test.   Urine leukocytes -     POCT urinalysis dipstick -     Urine Culture      The patient is advised to call or return to clinic if she does not see an improvement in symptoms, or to seek the care of the closest emergency department if she worsens with the above plan.   Deliah BostonMichael Clark, MHS, PA-C Primary Care at [facility] 05/14/2017 9:19 AM

## 2017-05-15 LAB — TSH: TSH: 1.28 u[IU]/mL (ref 0.450–4.500)

## 2017-05-15 LAB — BASIC METABOLIC PANEL
BUN/Creatinine Ratio: 12 (ref 9–23)
BUN: 11 mg/dL (ref 6–24)
CALCIUM: 9.4 mg/dL (ref 8.7–10.2)
CHLORIDE: 95 mmol/L — AB (ref 96–106)
CO2: 18 mmol/L — AB (ref 20–29)
Creatinine, Ser: 0.91 mg/dL (ref 0.57–1.00)
GFR calc Af Amer: 87 mL/min/{1.73_m2} (ref >59)
GFR calc non Af Amer: 75 mL/min/{1.73_m2} (ref >59)
GLUCOSE: 398 mg/dL — AB (ref 65–99)
POTASSIUM: 3.8 mmol/L (ref 3.5–5.2)
Sodium: 131 mmol/L — ABNORMAL LOW (ref 134–144)

## 2017-05-15 LAB — INSULIN AND C-PEPTIDE, SERUM
C-Peptide: 4.4 ng/mL (ref 1.1–4.4)
INSULIN: 18.7 u[IU]/mL (ref 2.6–24.9)

## 2017-05-15 LAB — LIPID PANEL
CHOLESTEROL TOTAL: 174 mg/dL (ref 100–199)
Chol/HDL Ratio: 5.6 ratio — ABNORMAL HIGH (ref 0.0–4.4)
HDL: 31 mg/dL — AB (ref >39)
LDL CALC: 103 mg/dL — AB (ref 0–99)
Triglycerides: 199 mg/dL — ABNORMAL HIGH (ref 0–149)
VLDL CHOLESTEROL CAL: 40 mg/dL (ref 5–40)

## 2017-05-16 ENCOUNTER — Other Ambulatory Visit: Payer: Self-pay | Admitting: Physician Assistant

## 2017-05-16 LAB — URINE CULTURE

## 2017-05-16 MED ORDER — CEPHALEXIN 750 MG PO CAPS
750.0000 mg | ORAL_CAPSULE | Freq: Two times a day (BID) | ORAL | 0 refills | Status: DC
Start: 2017-05-16 — End: 2017-06-25

## 2017-05-16 NOTE — Progress Notes (Signed)
FYI: Patient taking keflex. I gave her a call. Deliah BostonMichael Clark, MS, PA-C 10:47 AM, 05/16/2017

## 2017-05-16 NOTE — Progress Notes (Signed)
Patient made aware of results via phone and will go to the pharmacy and pick up keflex. Deliah BostonMichael Clark, MS, PA-C 8:48 AM, 05/16/2017

## 2017-05-27 ENCOUNTER — Encounter: Payer: Self-pay | Admitting: Physician Assistant

## 2017-06-15 ENCOUNTER — Encounter: Payer: Self-pay | Admitting: Physician Assistant

## 2017-06-25 ENCOUNTER — Encounter: Payer: Self-pay | Admitting: Physician Assistant

## 2017-06-25 ENCOUNTER — Other Ambulatory Visit: Payer: Self-pay

## 2017-06-25 ENCOUNTER — Ambulatory Visit: Payer: 59 | Admitting: Physician Assistant

## 2017-06-25 VITALS — BP 138/78 | HR 98 | Temp 98.4°F | Resp 16 | Ht 68.5 in | Wt 248.0 lb

## 2017-06-25 DIAGNOSIS — N3000 Acute cystitis without hematuria: Secondary | ICD-10-CM | POA: Diagnosis not present

## 2017-06-25 DIAGNOSIS — E119 Type 2 diabetes mellitus without complications: Secondary | ICD-10-CM | POA: Diagnosis not present

## 2017-06-25 DIAGNOSIS — I10 Essential (primary) hypertension: Secondary | ICD-10-CM | POA: Diagnosis not present

## 2017-06-25 DIAGNOSIS — F17200 Nicotine dependence, unspecified, uncomplicated: Secondary | ICD-10-CM | POA: Diagnosis not present

## 2017-06-25 LAB — POCT URINALYSIS DIP (MANUAL ENTRY)
Bilirubin, UA: NEGATIVE
GLUCOSE UA: NEGATIVE mg/dL
Ketones, POC UA: NEGATIVE mg/dL
NITRITE UA: NEGATIVE
Spec Grav, UA: 1.01 (ref 1.010–1.025)
UROBILINOGEN UA: 0.2 U/dL
pH, UA: 6.5 (ref 5.0–8.0)

## 2017-06-25 LAB — POCT GLYCOSYLATED HEMOGLOBIN (HGB A1C): Hemoglobin A1C: 9.8

## 2017-06-25 MED ORDER — METFORMIN HCL ER 500 MG PO TB24: 2000.0000 mg | ORAL_TABLET | Freq: Every day | ORAL | 3 refills | Status: DC

## 2017-06-25 MED ORDER — NITROFURANTOIN MONOHYD MACRO 100 MG PO CAPS: 100.0000 mg | ORAL_CAPSULE | Freq: Two times a day (BID) | ORAL | 0 refills | Status: AC

## 2017-06-25 NOTE — Patient Instructions (Signed)
     IF you received an x-ray today, you will receive an invoice from [facility]. Please contact [facility] at [phone] with questions or concerns regarding your invoice.   IF you received labwork today, you will receive an invoice from [facility]. Please contact [facility] at [phone] with questions or concerns regarding your invoice.   Our billing staff will not be able to assist you with questions regarding bills from these companies.  You will be contacted with the lab results as soon as they are available. The fastest way to get your results is to activate your My Chart account. Instructions are located on the last page of this paperwork. If you have not heard from us regarding the results in 2 weeks, please contact this office.     

## 2017-06-25 NOTE — Addendum Note (Signed)
Addended by: Ofilia NeasLARK, Luara Faye L on: 06/25/2017 09:28 AM   Modules accepted: Orders

## 2017-06-25 NOTE — Progress Notes (Addendum)
06/25/2017 9:03 AM   DOB: 15-Aug-1970 / MRN: [id number]  SUBJECTIVE:  [name] is a 47 y.o. female presenting for diabetes recheck.  Tells me that she has been having 2-3 loose stools daily along with some fecal urgency.  This started with metformin therapy.  She has been taking medication persistently despite the diarrhea.  She tells me that she has made some significant changes to her diet particularly trying to reduce carbohydrate intake.  She tells me that she has not had any bread, potatoes, sweets and yesterday she is treated with 1  bagel.  She had some GERD that seemed to resolve about 2 weeks ago.  She had no shortness of breath at this and denies diaphoresis, dizziness.  She denies stocking glove paresthesia.  She is taking her blood pressure medicine  as prescribed and not missing doses.  Again no chest pain, shortness of breath, headache, vision changes.  She continues to smoke.  She is not ready to quit at this time.  She has No Known Allergies.   She  has a past medical history of Allergy, Hyperlipidemia, and Hypertension.    She  reports that she has been smoking.  she has never used smokeless tobacco. She reports that she drinks alcohol. She reports that she does not use drugs. She  reports that she currently engages in sexual activity. She reports using the following method of birth control/protection: Implant. The patient  has no past surgical history on file.  Her family history includes Diabetes in her maternal grandmother.  Review of Systems  Constitutional: Negative for chills, diaphoresis and fever.  Eyes: Negative.   Respiratory: Negative for shortness of breath.   Cardiovascular: Negative for chest pain, orthopnea and leg swelling.  Gastrointestinal: Positive for diarrhea. Negative for abdominal pain, blood in stool, constipation, heartburn, melena, nausea and vomiting.  Genitourinary: Negative for flank pain.  Skin: Negative for rash.  Neurological:  Negative for dizziness, sensory change, speech change, focal weakness and headaches.    The problem list and medications were reviewed and updated by myself where necessary and exist elsewhere in the encounter.   OBJECTIVE:  BP 138/78 (BP Location: Left Arm, Patient Position: Sitting, Cuff Size: Large)   Pulse 98   Temp 98.4 F (36.9 C) (Oral)   Resp 16   Ht 5' 8.5" (1.74 m)   Wt 248 lb (112.5 kg)   SpO2 99%   BMI 37.16 kg/m   Physical Exam  Constitutional: She is active.  Non-toxic appearance.  Cardiovascular: Normal rate, regular rhythm, S1 normal, S2 normal, normal heart sounds and intact distal pulses. Exam reveals no gallop, no friction rub and no decreased pulses.  No murmur heard. Pulmonary/Chest: Effort normal. No stridor. No tachypnea. No respiratory distress. She has no wheezes. She has no rales.  Abdominal: She exhibits no distension.  Musculoskeletal: She exhibits no edema.  Neurological: She is alert.  Skin: Skin is warm and dry. She is not diaphoretic. No pallor.    Wt Readings from Last 3 Encounters:  06/25/17 248 lb (112.5 kg)  05/14/17 250 lb (113.4 kg)  04/29/17 252 lb 3.2 oz (114.4 kg)   Temp Readings from Last 3 Encounters:  06/25/17 98.4 F (36.9 C) (Oral)  04/29/17 98.1 F (36.7 C) (Oral)  08/28/14 97.8 F (36.6 C) (Oral)   BP Readings from Last 3 Encounters:  06/25/17 138/78  05/14/17 (!) 150/82  04/29/17 132/68   Pulse Readings from Last 3 Encounters:  06/25/17 98  05/14/17 98  04/29/17 (!) 104   Results for orders placed or performed in visit on 06/25/17 (from the past 72 hour(s))  POCT glycosylated hemoglobin (Hb A1C)     Status: None   Collection Time: 06/25/17  8:55 AM  Result Value Ref Range   Hemoglobin A1C 9.8     No results found.  ASSESSMENT AND PLAN:  Patricia GuadalajaraChrista was seen today for diabetes.  Diagnoses and all orders for this visit:  Type 2 diabetes mellitus without complication, without long-term current use of insulin  (HCC): A1c is trending down beautifully.  She has been medicated for a little over a month.  She is losing weight and monitoring her carbohydrate.  Given her new onset diarrhea I am changing her over to a long-acting metformin.  Hopefully this will alleviate some of the side effects.  BP is not at goal in the office however she has several ambulatory measures showing blood pressures of around 120/80.  I think if she continues with her lifestyle changes she  should be at goal within the next 6 months to a year.  I will see her back in 3 months.  We will discuss her smoking at that time and try to implement a plan.  She is not ready for this today -     POCT glycosylated hemoglobin (Hb A1C) -     POCT urinalysis dipstick -     metFORMIN (GLUCOPHAGE-XR) 500 MG 24 hr tablet; Take 4 tablets (2,000 mg total) by mouth daily with breakfast. -     Renal Function Panel -     Hepatic Function Panel  Hypertension, unspecified type: See problem 1  Current smoker: See problem 1  Addendum is appear that she has a UTI.  Started nitrofurantoin and culture out.  Vitals are normal.  The patient is advised to call or return to clinic if she does not see an improvement in symptoms, or to seek the care of the closest emergency department if she worsens with the above plan.   Deliah BostonMichael Jenascia Bumpass, MHS, PA-C Primary Care at [facility] 06/25/2017 9:03 AM

## 2017-06-26 LAB — RENAL FUNCTION PANEL
Albumin: 4.5 g/dL (ref 3.5–5.5)
BUN / CREAT RATIO: 13 (ref 9–23)
BUN: 12 mg/dL (ref 6–24)
CHLORIDE: 100 mmol/L (ref 96–106)
CO2: 19 mmol/L — AB (ref 20–29)
Calcium: 9.3 mg/dL (ref 8.7–10.2)
Creatinine, Ser: 0.9 mg/dL (ref 0.57–1.00)
GFR calc non Af Amer: 76 mL/min/{1.73_m2} (ref >59)
GFR, EST AFRICAN AMERICAN: 88 mL/min/{1.73_m2} (ref >59)
GLUCOSE: 164 mg/dL — AB (ref 65–99)
Phosphorus: 2.9 mg/dL (ref 2.5–4.5)
Potassium: 4 mmol/L (ref 3.5–5.2)
SODIUM: 137 mmol/L (ref 134–144)

## 2017-06-26 LAB — HEPATIC FUNCTION PANEL
ALK PHOS: 84 IU/L (ref 39–117)
ALT: 19 IU/L (ref 0–32)
AST: 13 IU/L (ref 0–40)
Bilirubin Total: <0.2 mg/dL (ref 0.0–1.2)
Bilirubin, Direct: 0.07 mg/dL (ref 0.00–0.40)
Total Protein: 6.9 g/dL (ref 6.0–8.5)

## 2017-06-27 LAB — URINE CULTURE

## 2017-07-14 ENCOUNTER — Encounter: Payer: Self-pay | Admitting: Physician Assistant

## 2017-07-14 ENCOUNTER — Other Ambulatory Visit: Payer: Self-pay | Admitting: Physician Assistant

## 2017-07-14 DIAGNOSIS — R0683 Snoring: Secondary | ICD-10-CM

## 2017-09-24 ENCOUNTER — Ambulatory Visit: Payer: 59 | Admitting: Physician Assistant

## 2017-09-27 ENCOUNTER — Ambulatory Visit: Payer: 59 | Admitting: Physician Assistant

## 2017-09-27 ENCOUNTER — Encounter: Payer: Self-pay | Admitting: Physician Assistant

## 2017-09-27 ENCOUNTER — Other Ambulatory Visit: Payer: Self-pay

## 2017-09-27 VITALS — BP 134/82 | HR 77 | Temp 97.7°F | Ht 68.0 in | Wt 245.6 lb

## 2017-09-27 DIAGNOSIS — Z114 Encounter for screening for human immunodeficiency virus [HIV]: Secondary | ICD-10-CM | POA: Diagnosis not present

## 2017-09-27 DIAGNOSIS — E119 Type 2 diabetes mellitus without complications: Secondary | ICD-10-CM

## 2017-09-27 DIAGNOSIS — Z23 Encounter for immunization: Secondary | ICD-10-CM | POA: Diagnosis not present

## 2017-09-27 LAB — POCT GLYCOSYLATED HEMOGLOBIN (HGB A1C): HEMOGLOBIN A1C: 7.5

## 2017-09-27 MED ORDER — ATORVASTATIN CALCIUM 20 MG PO TABS: 20.0000 mg | ORAL_TABLET | Freq: Every day | ORAL | 1 refills | Status: DC

## 2017-09-27 MED ORDER — DAPAGLIFLOZIN PROPANEDIOL 10 MG PO TABS: 5.0000 mg | ORAL_TABLET | Freq: Every day | ORAL | 1 refills | Status: DC

## 2017-09-27 NOTE — Patient Instructions (Addendum)
Good to see you today Ms. [name].  See you in 6 months. If dizzy when standing or burning with urination then come back in.    Start atorvastatin in about 1 month. Start with 1 half tab for two weeks then go to the full tab.     IF you received an x-ray today, you will receive an invoice from [facility]. Please contact [facility] at [phone] with questions or concerns regarding your invoice.   IF you received labwork today, you will receive an invoice from [facility]. Please contact [facility] at [phone] with questions or concerns regarding your invoice.   Our billing staff will not be able to assist you with questions regarding bills from these companies.  You will be contacted with the lab results as soon as they are available. The fastest way to get your results is to activate your My Chart account. Instructions are located on the last page of this paperwork. If you have not heard from [place] regarding the results in 2 weeks, please contact this office.     DTaP Vaccine (Diphtheria, Tetanus, and Pertussis): What You Need to Know 1. Why get vaccinated? Diphtheria, tetanus, and pertussis are serious diseases caused by bacteria. Diphtheria and pertussis are spread from person to person. Tetanus enters the body through cuts or wounds. DIPHTHERIA causes a thick covering in the back of the throat.  It can lead to breathing problems, paralysis, heart failure, and even death.  TETANUS (Lockjaw) causes painful tightening of the muscles, usually all over the body.  It can lead to "locking" of the jaw so the victim cannot open his mouth or swallow. Tetanus leads to death in up to 2 out of 10 cases.  PERTUSSIS (Whooping Cough) causes coughing spells so bad that it is hard for infants to eat, drink, or breathe. These spells can last for weeks.  It can lead to pneumonia, seizures (jerking and staring spells), brain damage, and death.  Diphtheria, tetanus, and pertussis  vaccine (DTaP) can help prevent these diseases. Most children who are vaccinated with DTaP will be protected throughout childhood. Many more children would get these diseases if we stopped vaccinating. DTaP is a safer version of an older vaccine called DTP. DTP is no longer used in the [place]. 2. Who should get DTaP vaccine and when? Children should get 5 doses of DTaP vaccine, one dose at each of the following ages:  2 months  4 months  6 months  15-18 months  4-6 years  DTaP may be given at the same time as other vaccines. 3. Some children should not get DTaP vaccine or should wait  Children with minor illnesses, such as a cold, may be vaccinated. But children who are moderately or severely ill should usually wait until they recover before getting DTaP vaccine.  Any child who had a life-threatening allergic reaction after a dose of DTaP should not get another dose.  Any child who suffered a brain or nervous system disease within 7 days after a dose of DTaP should not get another dose.  Talk with your doctor if your child: ? had a seizure or collapsed after a dose of DTaP, ? cried non-stop for 3 hours or more after a dose of DTaP, ? had a fever over 105F after a dose of DTaP. Ask your doctor for more information. Some of these children should not get another dose of pertussis vaccine, but may get a vaccine without pertussis, called DT. 4. Older children and  adults DTaP is not licensed for adolescents, adults, or children 47 years of age and older. But older people still need protection. A vaccine called Tdap is similar to DTaP. A single dose of Tdap is recommended for people 11 through 48 years of age. Another vaccine, called Td, protects against tetanus and diphtheria, but not pertussis. It is recommended every 10 years. There are separate Vaccine Information Statements for these vaccines. 5. What are the risks from DTaP vaccine? Getting diphtheria, tetanus, or pertussis  disease is much riskier than getting DTaP vaccine. However, a vaccine, like any medicine, is capable of causing serious problems, such as severe allergic reactions. The risk of DTaP vaccine causing serious harm, or death, is extremely small. Mild problems (common)  Fever (up to about 1 child in 4)  Redness or swelling where the shot was given (up to about 1 child in 4)  Soreness or tenderness where the shot was given (up to about 1 child in 4) These problems occur more often after the 4th and 5th doses of the DTaP series than after earlier doses. Sometimes the 4th or 5th dose of DTaP vaccine is followed by swelling of the entire arm or leg in which the shot was given, lasting 1-7 days (up to about 1 child in 30). Other mild problems include:  Fussiness (up to about 1 child in 3)  Tiredness or poor appetite (up to about 1 child in 10)  Vomiting (up to about 1 child in 50) These problems generally occur 1-3 days after the shot. Moderate problems (uncommon)  Seizure (jerking or staring) (about 1 child out of 14,000)  Non-stop crying, for 3 hours or more (up to about 1 child out of 1,000)  High fever, over 105F (about 1 child out of 16,000) Severe problems (very rare)  Serious allergic reaction (less than 1 out of a million doses)  Several other severe problems have been reported after DTaP vaccine. These include: ? Long-term seizures, coma, or lowered consciousness ? Permanent brain damage. These are so rare it is hard to tell if they are caused by the vaccine. Controlling fever is especially important for children who have had seizures, for any reason. It is also important if another family member has had seizures. You can reduce fever and pain by giving your child an aspirin-free pain reliever when the shot is given, and for the next 24 hours, following the package instructions. 6. What if there is a serious reaction? What should I look for? Look for anything that concerns you,  such as signs of a severe allergic reaction, very high fever, or behavior changes. Signs of a severe allergic reaction can include hives, swelling of the face and throat, difficulty breathing, a fast heartbeat, dizziness, and weakness. These would start a few minutes to a few hours after the vaccination. What should I do?  If you think it is a severe allergic reaction or other emergency that can't wait, call 9-1-1 or get the person to the nearest hospital. Otherwise, call your doctor.  Afterward, the reaction should be reported to the Vaccine Adverse Event Reporting System (VAERS). Your doctor might file this report, or you can do it yourself through the VAERS web site at www.vaers.LAgents.no, or by calling 1-(908)873-4968. ? VAERS is only for reporting reactions. They do not give medical advice. 7. The National Vaccine Injury Compensation Program The National Vaccine Injury Compensation Program (VICP) is a federal program that was created to compensate people who may have been injured  by certain vaccines. Persons who believe they may have been injured by a vaccine can learn about the program and about filing a claim by calling 1-480-045-4502 or visiting the VICP website at SpiritualWord.at. 8. How can I learn more?  Ask your doctor.  Call your local or state health department.  Contact the Centers for Disease Control and Prevention (CDC): ? Call 209-697-5085 (1-800-CDC-INFO) or ? Visit CDC's website at PicCapture.uy CDC DTaP Vaccine (Diphtheria, Tetanus, and Pertussis) VIS (10/08/05) This information is not intended to replace advice given to you by your health care provider. Make sure you discuss any questions you have with your health care provider. Document Released: 03/08/2006 Document Revised: 01/30/2016 Document Reviewed: 01/30/2016 Elsevier Interactive Patient Education  2017 [employer].

## 2017-09-27 NOTE — Progress Notes (Signed)
09/27/2017 9:19 AM   DOB: [birth year] / MRN: [id number]  SUBJECTIVE:  [name] is a 47 y.o. female presenting for diabetes recheck.  She tells me that she had to stop her metformin due to fecal urgency and a few episodes of fecal incontinence.  She has been on the medication now for at least 1/2-year and just simply cannot go the side effects anymore.  I had tried to place her on a long-acting however she continued to have this problem.  She is willing to try different class of diabetes medication.  She feels well today he denies complaint.  She denies stocking glove paresthesias and changes in vision.  She denies chest pain shortness of breath and dyspnea on exertion.  She is not taking an aspirin daily.  She is willing to start a statin.  She is cut down her smoking to 1/2 pack daily and is not ready to quit yet.  Immunization History  Administered Date(s) Administered  . Pneumococcal Polysaccharide-23 05/14/2017  . Tdap 09/27/2017     She has No Known Allergies.   She  has a past medical history of Allergy, Hyperlipidemia, and Hypertension.    She  reports that she has been smoking.  She has never used smokeless tobacco. She reports that she drinks alcohol. She reports that she does not use drugs. She  reports that she currently engages in sexual activity. She reports using the following method of birth control/protection: Implant. The patient  has no past surgical history on file.  Her family history includes Diabetes in her maternal grandmother.  ROS per HPI  The problem list and medications were reviewed and updated by myself where necessary and exist elsewhere in the encounter.   OBJECTIVE:  BP 134/82   Pulse 77   Temp 97.7 F (36.5 C) (Oral)   Ht  (1.727 m)   Wt 245 lb 9.6 oz (111.4 kg)   SpO2 97%   BMI 37.34 kg/m      Physical Exam  Constitutional: She is oriented to person, place, and time. She appears well-nourished. No distress.  Eyes: Pupils are  equal, round, and reactive to light. EOM are normal.  Cardiovascular: Normal rate, regular rhythm, S1 normal, S2 normal, normal heart sounds and intact distal pulses. Exam reveals no gallop, no friction rub and no decreased pulses.  No murmur heard. Pulses:      Dorsalis pedis pulses are 2+ on the right side, and 2+ on the left side.       Posterior tibial pulses are 2+ on the right side, and 2+ on the left side.  Pulmonary/Chest: Effort normal. No stridor. No respiratory distress. She has no wheezes. She has no rales.  Abdominal: She exhibits no distension.  Musculoskeletal: She exhibits no edema.  Neurological: She is alert and oriented to person, place, and time. No cranial nerve deficit. Gait normal.  Skin: Skin is dry. She is not diaphoretic.  Psychiatric: She has a normal mood and affect.  Vitals reviewed.   Results for orders placed or performed in visit on 09/27/17 (from the past 72 hour(s))  POCT glycosylated hemoglobin (Hb A1C)     Status: None   Collection Time: 09/27/17  8:48 AM  Result Value Ref Range   Hemoglobin A1C 7.5    The 10-year ASCVD risk score Denman George DC Jr., et al., 2013) is: 15.1%   Values used to calculate the score:     Age: 69 years  Sex: Female     Is Non-Hispanic African American: No     Diabetic: Yes     Tobacco smoker: Yes     Systolic Blood Pressure: 134 mmHg     Is BP treated: Yes     HDL Cholesterol: 31 mg/dL     Total Cholesterol: 174 mg/dL   No results found.  ASSESSMENT AND PLAN:  Cherina was seen today for follow-up and medication management.  Diagnoses and all orders for this visit:  Type 2 diabetes mellitus without complication, without long-term current use of insulin (HCC) -     POCT glycosylated hemoglobin (Hb A1C) -     Microalbumin, urine -     Lipid panel -     Renal Function Panel  Screening for HIV (human immunodeficiency virus) -     HIV antibody  Need for diphtheria-tetanus-pertussis (Tdap) vaccine -     Tdap  vaccine greater than or equal to 7yo IM  Other orders -     dapagliflozin propanediol (FARXIGA) 10 MG TABS tablet; Take 5-10 mg by mouth daily. Take 5 mg daily for the first 2 weeks then go up to the full tab. -     atorvastatin (LIPITOR) 20 MG tablet; Take 1 tablet (20 mg total) by mouth daily.    The patient is advised to call or return to clinic if she does not see an improvement in symptoms, or to seek the care of the closest emergency department if she worsens with the above plan.   Deliah Boston, MHS, PA-C Primary Care at [facility] 09/27/2017 9:19 AM

## 2017-09-28 LAB — RENAL FUNCTION PANEL
ALBUMIN: 4.4 g/dL (ref 3.5–5.5)
BUN/Creatinine Ratio: 16 (ref 9–23)
BUN: 14 mg/dL (ref 6–24)
CHLORIDE: 100 mmol/L (ref 96–106)
CO2: 21 mmol/L (ref 20–29)
Calcium: 9.5 mg/dL (ref 8.7–10.2)
Creatinine, Ser: 0.9 mg/dL (ref 0.57–1.00)
GFR calc Af Amer: 88 mL/min/{1.73_m2} (ref >59)
GFR calc non Af Amer: 76 mL/min/{1.73_m2} (ref >59)
GLUCOSE: 158 mg/dL — AB (ref 65–99)
PHOSPHORUS: 3.3 mg/dL (ref 2.5–4.5)
POTASSIUM: 3.6 mmol/L (ref 3.5–5.2)
Sodium: 138 mmol/L (ref 134–144)

## 2017-09-28 LAB — LIPID PANEL
Chol/HDL Ratio: 5 ratio — ABNORMAL HIGH (ref 0.0–4.4)
Cholesterol, Total: 203 mg/dL — ABNORMAL HIGH (ref 100–199)
HDL: 41 mg/dL (ref >39)
LDL Calculated: 126 mg/dL — ABNORMAL HIGH (ref 0–99)
Triglycerides: 178 mg/dL — ABNORMAL HIGH (ref 0–149)
VLDL Cholesterol Cal: 36 mg/dL (ref 5–40)

## 2017-09-28 LAB — MICROALBUMIN, URINE: MICROALBUM., U, RANDOM: 19.9 ug/mL

## 2017-09-28 LAB — HIV ANTIBODY (ROUTINE TESTING W REFLEX): HIV SCREEN 4TH GENERATION: NONREACTIVE

## 2017-09-30 ENCOUNTER — Ambulatory Visit: Payer: 59 | Admitting: Neurology

## 2017-09-30 ENCOUNTER — Encounter: Payer: Self-pay | Admitting: Neurology

## 2017-09-30 VITALS — BP 153/97 | HR 95 | Ht 68.0 in | Wt 243.0 lb

## 2017-09-30 DIAGNOSIS — M791 Myalgia, unspecified site: Secondary | ICD-10-CM

## 2017-09-30 DIAGNOSIS — E785 Hyperlipidemia, unspecified: Secondary | ICD-10-CM

## 2017-09-30 DIAGNOSIS — E669 Obesity, unspecified: Secondary | ICD-10-CM | POA: Insufficient documentation

## 2017-09-30 DIAGNOSIS — E118 Type 2 diabetes mellitus with unspecified complications: Secondary | ICD-10-CM

## 2017-09-30 DIAGNOSIS — G933 Postviral fatigue syndrome: Secondary | ICD-10-CM

## 2017-09-30 DIAGNOSIS — G478 Other sleep disorders: Secondary | ICD-10-CM | POA: Diagnosis not present

## 2017-09-30 DIAGNOSIS — G9331 Postviral fatigue syndrome: Secondary | ICD-10-CM

## 2017-09-30 DIAGNOSIS — E1169 Type 2 diabetes mellitus with other specified complication: Secondary | ICD-10-CM | POA: Insufficient documentation

## 2017-09-30 NOTE — Progress Notes (Signed)
SLEEP MEDICINE CLINIC   Provider:  Melvyn Novas, M D  Primary Care Physician:  Silvestre Mesi   Referring Provider: Ofilia Neas, PA-C   Chief Complaint  Patient presents with  . New Patient (Initial Visit)    pt alone, rm 11. pt states in [place] she had a sleep study completed. in nov. got flu,UTI and was sick until Jan. pt was not sleeping well, she was falling asleep but not staying asleep. started to have daytime sleepiness. pt does state that her sleep is slightly better she has lost weight, bought a new mattress.     HPI:  [name] is a 47 y.o. female patient  , seen here on 09-30-2017 as in a referral/ revisit  from [facility] for a sleep evaluation.  [name] feels that her sleep is not restorative and refreshing, very fragmented.  She just does not have sleep of high quality.  She does not have main troubles with initiating sleep but to sustain sleep is difficult.  In addition she has been sleepy in daytime. [name] stated that she suffered first from the flu in November last year but that that started a whole development of recurrent UTIs and other medical problems but also culminated in very little restorative sleep.  She was able to lose weight, however she remains overweight at this time.  The sleep quality has not significantly improved.  She underwent a home sleep test on 2 consecutive nights ordered by PA Clark.  The device was mailed to her she wore it for 2 nights the second night for a significantly shorter time than the first and states that she felt she was not sleeping well at all.  The clinical comments and impressions stated that the patient was forced 32% of the night in rem sleep which is very unlikely.  The sleep study further indicated that she was snoring loudly.  The recording time was 5.5 to 3 hours for the first night and a little over 4 hours for the second night.  The sleep study estimated that the patient fell asleep within 15  minutes after lights were turned off but she did not have any slow-wave sleep, and that her REM latency was 139 minutes.  For most of the night she slept supine.  Again I am not sure how a home sleep test can estimates the sleep architecture at the conclusion was that the sleep architecture the arousal index were outside of age and gender matched normative ranges what ever this is supposed to mean.  She wakes up a lot she sleeps poorly and a diagnosis of apnea was not conclusively night.  The patient now does not feel that she is excessively sleepy in daytime and endorsed the Epworth Sleepiness Scale only at 2- 4 points the fatigue severity only at 16 points but she feels that she does not get enough sleep.    Chief complaint according to patient : " I am just tired "   Sleep habits are as follows:  [name] lives alone but her boyfriend stays over and has witnessed her to sleep without snoring.  Her usual bedtime is around 11 pm, and she is usually asleep within less than 30 minutes.  She sleeps with multiple pillows on her side.  The bedroom is cool, quiet and dark and conducive to sleep.  Once asleep she usually can sleep through for about 2-3 hours, she will have one bathroom break in the  middle of the night that also wakes her up, and then she may have 1 or 2 more spontaneous arousals and she is not always sure what work.  She believes that she gets less than 6 hours of sleep on a regular day.  She feels that the sleep in the morning hours as a deepest and most restorative for her and it is difficult to then rise and start the day.  Her usual rise time in the morning is between 6 and 7 AM.   Since her  HST/ sleep study in January she has lost weight, bought a new mattress and has changed some of her sleep habits.  She also feels that she is now able to dream more often at night.   Sleep medical history and family sleep history: no history of sleeping disorder.  Weight gain - since early  thirties   Social history: lives alone, childless, but boyfriend stays over. 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 Daytime only,  Lives with a dog.  Smokes- 1/4 ppDay, smokes for the last 10 years.  ETOH- Weekends.  Caffeine -none. Quit 3 years ago.   Review of Systems: Out of a complete 14 system review, the patient complains of only the following symptoms, and all other reviewed systems are negative.    "Poor sleep quality ".  The patient endorsed allergies, the feeling of not getting enough sleep, daytime sleepiness and fatigue.  She also has a history of obesity, hypertension, diabetes.  Epworth score 2-4  , Fatigue severity score 16 , depression score n/a    Social History   Socioeconomic History  . Marital status: Married    Spouse name: Not on file  . Number of children: Not on file  . Years of education: Not on file  . Highest education level: Not on file  Occupational History  . Not on file  Social Needs  . Financial resource strain: Not on file  . Food insecurity:    Worry: Not on file    Inability: Not on file  . Transportation needs:    Medical: Not on file    Non-medical: Not on file  Tobacco Use  . Smoking status: Current Every Day Smoker    Packs/day: 0.50    Last attempt to quit: 08/23/2003    Years since quitting: 14.1  . Smokeless tobacco: Never Used  Substance and Sexual Activity  . Alcohol use: Yes    Alcohol/week: 0.0 oz    Comment: occasionally  . Drug use: No  . Sexual activity: Yes    Birth control/protection: Implant  Lifestyle  . Physical activity:    Days per week: Not on file    Minutes per session: Not on file  . Stress: Not on file  Relationships  . Social connections:    Talks on phone: Not on file    Gets together: Not on file    Attends religious service: Not on file    Active member of club or organization: Not on file    Attends meetings of clubs or organizations: Not on file    Relationship status: Not on file   . Intimate partner violence:    Fear of current or ex partner: Not on file    Emotionally abused: Not on file    Physically abused: Not on file    Forced sexual activity: Not on file  Other Topics Concern  . Not on file  Social History Narrative  . Not on  file    Family History  Problem Relation Age of Onset  . Diabetes Maternal Grandmother   . Colon cancer Mother     Past Medical History:  Diagnosis Date  . Allergy    seasonal  . Diabetes mellitus without complication (HCC)   . Hyperlipidemia   . Hypertension     Past Surgical History:  Procedure Laterality Date  . TONSILLECTOMY    . WISDOM TOOTH EXTRACTION      Current Outpatient Medications  Medication Sig Dispense Refill  . atorvastatin (LIPITOR) 20 MG tablet Take 1 tablet (20 mg total) by mouth daily. 90 tablet 1  . dapagliflozin propanediol (FARXIGA) 10 MG TABS tablet Take 5-10 mg by mouth daily. Take 5 mg daily for the first 2 weeks then go up to the full tab. 90 tablet 1  . etonogestrel (IMPLANON) 68 MG IMPL implant Inject 1 each into the skin once.    . hydrochlorothiazide (HYDRODIURIL) 25 MG tablet Take 1 tablet (25 mg total) by mouth daily. 90 tablet 1  . losartan (COZAAR) 50 MG tablet Take 1 tablet (50 mg total) by mouth daily. 90 tablet 1   No current facility-administered medications for this visit.     Allergies as of 09/30/2017  . (No Known Allergies)    Vitals: BP (!) 153/97   Pulse 95   Ht  (1.727 m)   Wt 243 lb (110.2 kg)   BMI 36.95 kg/m  Last Weight:  Wt Readings from Last 1 Encounters:  09/30/17 243 lb (110.2 kg)   ZOX:WRUE mass index is 36.95 kg/m.     Last Height:   Ht Readings from Last 1 Encounters:  09/30/17  (1.727 m)    Physical exam:  General: The patient is awake, alert and appears not in acute distress. The patient is well groomed. Head: Normocephalic, atraumatic. Neck is supple. Mallampati 1. neck circumference:15.5 ". Nasal airflow patency ,  Retrognathia  is not seen.  No history of braces or retainers.  Cardiovascular:  Regular rate and rhythm , without  murmurs or carotid bruit, and without distended neck veins. Respiratory: Lungs are clear to auscultation. Skin:  Without evidence of edema, or rash Trunk: BMI is 38. The patient's posture is erect   Neurologic exam : The patient is awake and alert, oriented to place and time.   Memory subjective  described as intact. and without nystagmus. Visual fields by finger perimetry are intact. Hearing to finger rub intact.   Facial sensation intact to fine touch.  Facial motor strength is symmetric and tongue and uvula move midline. Shoulder shrug was symmetrical.   Motor exam:   Normal tone, muscle bulk and symmetric strength in all extremities. Sensory:  Fine touch, pinprick and vibration / Proprioception tested in the upper extremities was normal. Coordination: Rapid alternating movements in the fingers/hands was normal. Finger-to-nose maneuver  normal without evidence of ataxia, dysmetria or tremor. Gait and station: Patient walks without assistive device Turns with  3 Steps. Deep tendon reflexes: in the  upper and lower extremities are symmetric and intact.    Assessment:  After physical and neurologic examination, review of laboratory studies,  Personal review of imaging studies, reports of other /same  Imaging studies, results of polysomnography and / or neurophysiology testing and pre-existing records as far as provided in visit., my assessment is :  I have the pleasure of seeing [name] today, who is a 47 year old Caucasian female that was just recently diagnosed with diabetes and  has been able to control her blood sugar by losing weight.  She has for a long time felt that her sleep does not have the restorative and refreshing qualities she craves, but it became acutely worse after she contracted the flu in November last year followed by several recurrent urinary tract infections.  In  January she underwent a 2 part home sleep test which is basically nonconclusive.  The study suggested that there were a lot of micro-arousals and autonomic activations however in an unattended sleep study it is very difficult to know if the patient moved if this is artifact, or if this is a true finding.  I would like to repeat a sleep study and at this time would much prefer to have an attended sleep study with the possibility of a split-night polysomnography.  We are looking at the patients that may snore according to the home sleep test but not witnessed at home.  There is a lot of contradiction between the patient's report of her sleep Architecture and her perceived fragmentation of sleep.    I will therefore order a split-night polysomnography to be split at AHI 20 , 3 % , AETNA may deny. In that case I would suggest HST by Watch pat.     1) fatigue , achy and sore- post viral-  Could be related to DM ?  OSA ?   2) obesity, morbid - with serious comorbidities.   3) a she has good sleep hygiene, routines and is not using any sleep aid- melatonin and advil Pm did not work.  Has no RLS.    The patient was advised of the nature of the diagnosed disorder , the treatment options and the  risks for general health and wellness arising from not treating the condition.   I spent more than 45  minutes of face to face time with the patient.  Greater than 50% of time was spent in counseling and coordination of care. We have discussed the diagnosis and differential and I answered the patient's questions.    Plan:  Treatment plan and additional workup :  Sleep study by HST was non-conclusive- please allow attended PSG study. I need to see true REM proportion and PLMs.  Postviral fatigue , myalgia- not EDS.    Melvyn Novas, MD 09/30/2017, 10:38 AM  Certified in Neurology by ABPN Certified in Sleep Medicine by [facility] [address], [address] [place], [place]  [address]

## 2017-10-04 ENCOUNTER — Encounter: Payer: Self-pay | Admitting: Physician Assistant

## 2017-10-15 ENCOUNTER — Telehealth: Payer: Self-pay | Admitting: Physician Assistant

## 2017-10-15 DIAGNOSIS — R030 Elevated blood-pressure reading, without diagnosis of hypertension: Secondary | ICD-10-CM

## 2017-10-15 MED ORDER — HYDROCHLOROTHIAZIDE 25 MG PO TABS: 25.0000 mg | ORAL_TABLET | Freq: Every day | ORAL | 1 refills | Status: DC

## 2017-10-15 MED ORDER — LOSARTAN POTASSIUM 50 MG PO TABS: 50.0000 mg | ORAL_TABLET | Freq: Every day | ORAL | 1 refills | Status: DC

## 2017-10-15 NOTE — Telephone Encounter (Signed)
Copied from CRM [id number]. Topic: Quick Communication - Rx Refill/Question >> Oct 15, 2017  2:41 PM Yvonna Alanis wrote: Medication: Hydrochlorothiazide (HYDRODIURIL) 25 MG tablet and Losartan (COZAAR) 50 MG tablet. Patient has requested 90 day supply for both.  Has the patient contacted their pharmacy? Yes.   (Agent: If no, request that the patient contact the pharmacy for the refill.) (Agent: If yes, when and what did the pharmacy advise?)  Preferred Pharmacy (with phone number or street name): [id number] [place], [place] - [address] [phone] (Phone) [phone] (Fax)  Agent: Please be advised that RX refills may take up to 3 business days. We ask that you follow-up with your pharmacy.

## 2017-10-15 NOTE — Telephone Encounter (Signed)
Copied from CRM [id number]. Topic: Quick Communication - Rx Refill/Question >> Oct 15, 2017  2:41 PM Yvonna Alanis wrote: Medication: Hydrochlorothiazide (HYDRODIURIL) 25 MG tablet and Losartan (COZAAR) 50 MG tablet  Has the patient contacted their pharmacy? Yes.   (Agent: If no, request that the patient contact the pharmacy for the refill.) (Agent: If yes, when and what did the pharmacy advise?)  Preferred Pharmacy (with phone number or street name): [id number] [place], [place] - [address] [phone] (Phone) [phone] (Fax)  Agent: Please be advised that RX refills may take up to 3 business days. We ask that you follow-up with your pharmacy.

## 2017-11-11 ENCOUNTER — Ambulatory Visit (INDEPENDENT_AMBULATORY_CARE_PROVIDER_SITE_OTHER): Payer: 59 | Admitting: Neurology

## 2017-11-11 DIAGNOSIS — E118 Type 2 diabetes mellitus with unspecified complications: Secondary | ICD-10-CM

## 2017-11-11 DIAGNOSIS — G4710 Hypersomnia, unspecified: Secondary | ICD-10-CM | POA: Diagnosis not present

## 2017-11-11 DIAGNOSIS — M791 Myalgia, unspecified site: Secondary | ICD-10-CM

## 2017-11-11 DIAGNOSIS — G933 Postviral fatigue syndrome: Secondary | ICD-10-CM

## 2017-11-11 DIAGNOSIS — G9331 Postviral fatigue syndrome: Secondary | ICD-10-CM

## 2017-11-11 DIAGNOSIS — E669 Obesity, unspecified: Secondary | ICD-10-CM

## 2017-11-12 NOTE — Procedures (Signed)
PATIENT'S NAME:  [name], Sramek DOB:      1970/12/14      MR#:    [id number]     DATE OF RECORDING: 11/11/2017 REFERRING M.D.:  Deliah Boston, PA Study Performed:   Baseline Polysomnogram with expanded EEG.  HISTORY:  [name] feels that her sleep is not restorative and refreshing, and very fragmented.  She just does not have sleep of high quality.  She does not have main troubles with initiating sleep but to sustain sleep is difficult.  In addition she has been sleepy in daytime. [name] stated that she suffered from the flu in November 2018 which started a serial development of recurrent UTIs and other medical problems- but also culminated in very little restorative sleep.  She was able to lose weight, however she remains overweight at this time.   The sleep quality has not significantly improved.  She underwent a home sleep and EEG test on 2 consecutive nights ordered by PA Clark.  The device was mailed to her, she wore it for 2 nights, the second night for a significantly shorter time than the first and states that she felt she was not sleeping well at all.  The clinical comments and impressions stated that the patient was 32% of the night in REM sleep which is very unlikely.   The patient endorsed the Epworth Sleepiness Scale at 3 points, FSS at 16 points.   The patient's weight 243 pounds with a height of 68 (inches), resulting in a BMI of 36.8 kg/m2. The patient's neck circumference measured 25.5 inches.  CURRENT MEDICATIONS: Lipitor, Farxiga, Implanon, Hydrodiuril, Cozaar   PROCEDURE:  This is a multichannel digital polysomnogram utilizing the Somnostar 11.2 system.  Electrodes and sensors were applied and monitored per AASM Specifications.   EEG, EOG, Chin and Limb EMG, were sampled at 200 Hz.  ECG, Snore and Nasal Pressure, Thermal Airflow, Respiratory Effort, CPAP Flow and Pressure, Oximetry was sampled at 50 Hz. Digital video and audio were recorded.      BASELINE  STUDY: Lights Out was at 22:00 and Lights On at 05:00.  Total recording time (TRT) was 420 minutes, with a total sleep time (TST) of 221.5 minutes.  The patient's sleep latency was 120.5 minutes.  REM latency was 95 minutes.  The sleep efficiency was poor at 52.7 %.     SLEEP ARCHITECTURE: WASO (Wake after sleep onset) was 122.5 minutes.  There were 10.5 minutes in Stage N1, 161.5 minutes Stage N2, 11.5 minutes Stage N3 and 38 minutes in Stage REM.  The percentage of Stage N1 was 4.7%, Stage N2 was 72.9%, Stage N3 was 5.2% and Stage R (REM sleep) was 17.2%.   RESPIRATORY ANALYSIS:  There were a total of 8 respiratory events:  0 obstructive apneas, 0 central apneas and 0 mixed apneas with a total of 0 apneas and an apnea index (AI) of 0 /hour. There were 8 hypopneas with a hypopnea index of 2.2 /hour. The patient also had 0 respiratory event related arousals (RERAs).The total APNEA/HYPOPNEA INDEX (AHI) was 2.2/hour and the total RESPIRATORY DISTURBANCE INDEX was 2.2 /hour.  2 events occurred in REM sleep and 12 events in NREM. The REM AHI was 3.2 /hour, versus a non-REM AHI of 2. The patient spent total sleep time of 222 minutes in non-supine. The supine AHI was 0.0 versus a non-supine AHI of 2.2.  OXYGEN SATURATION & C02:  The Wake baseline 02 saturation was 94%, with the lowest being 86%. Time  spent below 89% saturation equaled 1 minute.   PERIODIC LIMB MOVEMENTS:   The patient had a total of 12 Periodic Limb Movements.  The Periodic Limb Movement (PLM) index was 3.3 and the PLM Arousal index was 0.8/hour.  Audio and video analysis did not show any abnormal or unusual movements, behaviors, phonations or vocalizations. The patient took one bathroom breaks. Snoring was noted. The patient was restless.   EKG was in keeping with normal sinus rhythm (NSR). Post-study, the patient indicated that sleep was the same as usual. Not very refreshing or restorative.    IMPRESSION:  1. Dysfunctions associated  with sleep stages or arousal from sleep, sleep fragmentation.  2. The patient snored loudly while sleeping non supine, there was little difference in sleep on the left versus right side.  3. REM sleep was seen at about 17.5% proportion of sleep and later in the morning hours. This is a normal distribution of REM sleep.    RECOMMENDATIONS: The patient has implemented good routines and sleep hygiene without the resulting benefit of more sustained, refreshing sleep. I was unable to identify any organic disorder aside from snoring, which should be treated with weight loss and could be addressed with a dental device. It was not position dependent.   Post viral illness and fatigue to be further evaluated by Internist/PCP.  A trial of Belsomra can be entertained to improve/ sustain sleep.    I certify that I have reviewed the entire raw data recording prior to the issuance of this report in accordance with the Standards of Accreditation of the American Academy of Sleep Medicine (AASM)    Melvyn Novasarmen Delailah Spieth, MD    11-12-2017  Diplomat, American Board of Psychiatry and Neurology  Diplomat, American Board of Sleep Medicine Medical Director, [facility]

## 2017-11-16 ENCOUNTER — Encounter: Payer: Self-pay | Admitting: Physician Assistant

## 2017-11-16 ENCOUNTER — Telehealth: Payer: Self-pay

## 2017-11-16 NOTE — Telephone Encounter (Signed)
I called pt, discussed her sleep study results with her. She reports that fragmented sleep is normal for her. She will discuss with her PCP a trial of belsomra, and further evaluation of possibly having fatigue due to a viral infection. No organic sleep disorder was able to be identified based on this study. Pt asked that I send a copy of this sleep study to Deliah BostonMichael Clark, PA. Pt verbalized understanding of results. Pt had no questions at this time but was encouraged to call back if questions arise.

## 2017-11-16 NOTE — Telephone Encounter (Signed)
-----   Message from Melvyn Novasarmen Dohmeier, MD sent at 11/12/2017  5:32 PM EDT ----- It was certainly not a good night's rest.  IMPRESSION:  1. Dysfunctions associated with sleep stages or arousal from  sleep, sleep fragmentation.  2. The patient snored loudly while sleeping non supine, there was  little difference in sleep on the left versus right side.  3. REM sleep was seen at about 17.5% proportion of sleep and  later in the morning hours. This is a normal distribution of REM  sleep.    RECOMMENDATIONS: The patient has implemented good routines and  sleep hygiene without the resulting benefit of more sustained,  refreshing sleep. I was unable to identify any organic disorder aside from snoring,  which should be treated with weight loss and could be addressed  with a dental device. It was not position dependent.   Post viral illness and fatigue to be further evaluated by  Internist/PCP.  A trial of Belsomra can be entertained to improve/ sustain sleep.

## 2017-11-17 ENCOUNTER — Other Ambulatory Visit: Payer: Self-pay | Admitting: Physician Assistant

## 2017-11-17 MED ORDER — SUVOREXANT 10 MG PO TABS: 10.0000 mg | ORAL_TABLET | Freq: Every day | ORAL | 1 refills | Status: DC

## 2017-12-13 ENCOUNTER — Encounter: Payer: Self-pay | Admitting: Physician Assistant

## 2017-12-14 MED ORDER — SUVOREXANT 20 MG PO TABS: 20.0000 mg | ORAL_TABLET | Freq: Every day | ORAL | 5 refills | Status: DC

## 2018-03-17 ENCOUNTER — Telehealth: Payer: Self-pay | Admitting: Physician Assistant

## 2018-03-17 NOTE — Telephone Encounter (Signed)
LVM for pt to call office and reschedule her appt with [facility] on 04/01/18. Provider will not be available that day.  When pt calls back, please reschedule at her convenience for: OV: med refills & Diabetes; previous Clark patient  Thank you!

## 2018-03-22 ENCOUNTER — Other Ambulatory Visit: Payer: Self-pay | Admitting: Physician Assistant

## 2018-03-22 DIAGNOSIS — E119 Type 2 diabetes mellitus without complications: Secondary | ICD-10-CM

## 2018-03-26 ENCOUNTER — Other Ambulatory Visit: Payer: Self-pay | Admitting: Physician Assistant

## 2018-03-26 DIAGNOSIS — R030 Elevated blood-pressure reading, without diagnosis of hypertension: Secondary | ICD-10-CM

## 2018-04-01 ENCOUNTER — Ambulatory Visit: Payer: 59 | Admitting: Physician Assistant

## 2018-04-05 ENCOUNTER — Ambulatory Visit: Payer: 59 | Admitting: Physician Assistant

## 2018-04-17 ENCOUNTER — Other Ambulatory Visit: Payer: Self-pay | Admitting: Physician Assistant

## 2018-04-17 DIAGNOSIS — E119 Type 2 diabetes mellitus without complications: Secondary | ICD-10-CM

## 2018-04-18 NOTE — Telephone Encounter (Signed)
Courtesy refill until appointment 04/27/18.

## 2018-04-27 ENCOUNTER — Ambulatory Visit: Payer: 59 | Admitting: Physician Assistant

## 2018-05-12 ENCOUNTER — Encounter: Payer: Self-pay | Admitting: Emergency Medicine

## 2018-05-12 ENCOUNTER — Ambulatory Visit: Payer: 59 | Admitting: Emergency Medicine

## 2018-05-12 ENCOUNTER — Other Ambulatory Visit: Payer: Self-pay

## 2018-05-12 VITALS — BP 163/88 | HR 104 | Temp 98.7°F | Resp 16 | Ht 67.5 in | Wt 264.4 lb

## 2018-05-12 DIAGNOSIS — I10 Essential (primary) hypertension: Secondary | ICD-10-CM

## 2018-05-12 DIAGNOSIS — E119 Type 2 diabetes mellitus without complications: Secondary | ICD-10-CM | POA: Diagnosis not present

## 2018-05-12 DIAGNOSIS — G4700 Insomnia, unspecified: Secondary | ICD-10-CM

## 2018-05-12 LAB — POCT GLYCOSYLATED HEMOGLOBIN (HGB A1C): Hemoglobin A1C: 7.2 % — AB (ref 4.0–5.6)

## 2018-05-12 LAB — GLUCOSE, POCT (MANUAL RESULT ENTRY): POC GLUCOSE: 171 mg/dL — AB (ref 70–99)

## 2018-05-12 MED ORDER — DAPAGLIFLOZIN PROPANEDIOL 10 MG PO TABS: 10.0000 mg | ORAL_TABLET | Freq: Every day | ORAL | 1 refills | Status: DC

## 2018-05-12 MED ORDER — LOSARTAN POTASSIUM 50 MG PO TABS: 50.0000 mg | ORAL_TABLET | Freq: Every day | ORAL | 1 refills | Status: DC

## 2018-05-12 MED ORDER — ZOLPIDEM TARTRATE 10 MG PO TABS: 10.0000 mg | ORAL_TABLET | Freq: Every evening | ORAL | 1 refills | Status: DC | PRN

## 2018-05-12 MED ORDER — ATORVASTATIN CALCIUM 20 MG PO TABS: 20.0000 mg | ORAL_TABLET | Freq: Every day | ORAL | 1 refills | Status: DC

## 2018-05-12 MED ORDER — HYDROCHLOROTHIAZIDE 25 MG PO TABS: 25.0000 mg | ORAL_TABLET | Freq: Every day | ORAL | 1 refills | Status: DC

## 2018-05-12 NOTE — Progress Notes (Signed)
Lab Results  Component Value Date   HGBA1C 7.5 09/27/2017   BP Readings from Last 3 Encounters:  05/12/18 (!) 163/88  09/30/17 (!) 153/97  09/27/17 134/82   [name] 47 y.o.   Chief Complaint  Patient presents with  . Medication Refill    per patient ALL and discuss Suvorexant    HISTORY OF PRESENT ILLNESS: This is a 47 y.o. female with history of diabetes and hypertension here for follow-up and medication refill.  Has been seen PA Clark.  First visit with me. On Farxiga.  Cannot tolerate metformin due to GI side effects. On losartan and hydrochlorothiazide.  Blood pressure at home 130s over 80s.  Asymptomatic. Also has a history of insomnia.  Had sleep study in the past.  No sleep apnea.  Has been taking Belsomra to help with insomnia with partial results.  Would like to try something else. Otherwise no complaints or medical concerns at this time.  HPI   Prior to Admission medications   Medication Sig Start Date End Date Taking? Authorizing Provider  atorvastatin (LIPITOR) 20 MG tablet TAKE 1 TABLET BY MOUTH EVERY DAY 03/22/18  Yes McVey, Madelaine Bhat, PA-C  etonogestrel (IMPLANON) 68 MG IMPL implant Inject 1 each into the skin once.   Yes [provider]  FARXIGA 10 MG TABS tablet TAKE 1/2 TABLET BY MOUTH DAILY FOR 1 WEEK, THEN INCREASE TO 1 TABLET BY MOUTH DAILY 04/18/18  Yes McVey, Madelaine Bhat, PA-C  hydrochlorothiazide (HYDRODIURIL) 25 MG tablet TAKE 1 TABLET BY MOUTH EVERY DAY 04/01/18  Yes Stallings, Zoe A, MD  losartan (COZAAR) 50 MG tablet TAKE 1 TABLET BY MOUTH EVERY DAY 04/01/18  Yes Stallings, Zoe A, MD  Suvorexant (BELSOMRA) 20 MG TABS Take 20 mg by mouth at bedtime. 12/14/17  Yes Ofilia Neas, PA-C    No Known Allergies  Patient Active Problem List   Diagnosis Date Noted  . Myalgia 09/30/2017  . PVFS (postviral fatigue syndrome) 09/30/2017  . Type 2 diabetes mellitus with complication, without long-term current use of insulin  (HCC) 09/30/2017  . Poor sleep pattern 09/30/2017  . Obesity (BMI 35.0-39.9 without comorbidity) 09/30/2017  . Type 2 diabetes mellitus without complication, without long-term current use of insulin (HCC) 06/25/2017  . HYPERTENSION 03/22/2007  . HEADACHE 03/22/2007    Past Medical History:  Diagnosis Date  . Allergy    seasonal  . Diabetes mellitus without complication (HCC)   . Hyperlipidemia   . Hypertension     Past Surgical History:  Procedure Laterality Date  . TONSILLECTOMY    . WISDOM TOOTH EXTRACTION      Social History   Socioeconomic History  . Marital status: Married    Spouse name: Not on file  . Number of children: Not on file  . Years of education: Not on file  . Highest education level: Not on file  Occupational History  . Not on file  Social Needs  . Financial resource strain: Not on file  . Food insecurity:    Worry: Not on file    Inability: Not on file  . Transportation needs:    Medical: Not on file    Non-medical: Not on file  Tobacco Use  . Smoking status: Current Every Day Smoker    Packs/day: 0.50    Last attempt to quit: 08/23/2003    Years since quitting: 14.7  . Smokeless tobacco: Never Used  Substance and Sexual Activity  . Alcohol use: Yes  Alcohol/week: 0.0 standard drinks    Comment: occasionally  . Drug use: No  . Sexual activity: Yes    Birth control/protection: Implant  Lifestyle  . Physical activity:    Days per week: Not on file    Minutes per session: Not on file  . Stress: Not on file  Relationships  . Social connections:    Talks on phone: Not on file    Gets together: Not on file    Attends religious service: Not on file    Active member of club or organization: Not on file    Attends meetings of clubs or organizations: Not on file    Relationship status: Not on file  . Intimate partner violence:    Fear of current or ex partner: Not on file    Emotionally abused: Not on file    Physically abused: Not on  file    Forced sexual activity: Not on file  Other Topics Concern  . Not on file  Social History Narrative  . Not on file    Family History  Problem Relation Age of Onset  . Diabetes Maternal Grandmother   . Colon cancer Mother      Review of Systems  Constitutional: Negative.  Negative for chills and fever.  HENT: Negative.  Negative for congestion and sore throat.   Eyes: Negative for blurred vision and double vision.  Respiratory: Negative.  Negative for cough and shortness of breath.   Cardiovascular: Negative.  Negative for chest pain and palpitations.  Gastrointestinal: Negative.  Negative for abdominal pain, blood in stool, nausea and vomiting.  Genitourinary: Negative.  Negative for dysuria.  Skin: Negative.  Negative for rash.  Neurological: Negative.  Negative for dizziness and headaches.  Endo/Heme/Allergies: Negative.   All other systems reviewed and are negative.   Vitals:   05/12/18 0834  BP: (!) 163/88  Pulse: (!) 104  Resp: 16  Temp: 98.7 F (37.1 C)  SpO2: 96%    Physical Exam Vitals signs reviewed.  Constitutional:      Appearance: Normal appearance.  HENT:     Head: Normocephalic and atraumatic.  Eyes:     Extraocular Movements: Extraocular movements intact.     Conjunctiva/sclera: Conjunctivae normal.     Pupils: Pupils are equal, round, and reactive to light.  Neck:     Musculoskeletal: Normal range of motion.  Cardiovascular:     Rate and Rhythm: Normal rate and regular rhythm.     Pulses: Normal pulses.     Heart sounds: Normal heart sounds.  Pulmonary:     Effort: Pulmonary effort is normal.     Breath sounds: Normal breath sounds.  Musculoskeletal: Normal range of motion.  Skin:    General: Skin is warm and dry.     Capillary Refill: Capillary refill takes less than 2 seconds.  Neurological:     General: No focal deficit present.     Mental Status: She is alert and oriented to person, place, and time.  Psychiatric:        Mood  and Affect: Mood normal.        Behavior: Behavior normal.     Results for orders placed or performed in visit on 05/12/18 (from the past 24 hour(s))  POCT glucose (manual entry)     Status: Abnormal   Collection Time: 05/12/18  9:16 AM  Result Value Ref Range   POC Glucose 171 (A) 70 - 99 mg/dl  POCT glycosylated hemoglobin (Hb A1C)  Status: Abnormal   Collection Time: 05/12/18  9:19 AM  Result Value Ref Range   Hemoglobin A1C 7.2 (A) 4.0 - 5.6 %   HbA1c POC (<> result, manual entry)     HbA1c, POC (prediabetic range)     HbA1c, POC (controlled diabetic range)      ASSESSMENT & PLAN: Type 2 diabetes mellitus without complication, without long-term current use of insulin (HCC) Hemoglobin A1c at 7.2 better than last time.  No change in treatment.  Continue present medication.  Advised on diet and exercise.  Follow-up in 3 months.  Essential hypertension Blood pressure is a little high in the office today.  In home readings within normal limits.  No change in treatment.  Continue present medication.  Follow-up in 3 months.  Ceclia was seen today for medication refill.  Diagnoses and all orders for this visit:  Type 2 diabetes mellitus without complication, without long-term current use of insulin (HCC) -     Comprehensive metabolic panel -     CBC -     Lipid panel -     HM Diabetes Foot Exam -     POCT glucose (manual entry) -     POCT glycosylated hemoglobin (Hb A1C) -     atorvastatin (LIPITOR) 20 MG tablet; Take 1 tablet (20 mg total) by mouth daily. -     dapagliflozin propanediol (FARXIGA) 10 MG TABS tablet; Take 10 mg by mouth daily.  Insomnia, unspecified type -     zolpidem (AMBIEN) 10 MG tablet; Take 1 tablet (10 mg total) by mouth at bedtime as needed for sleep.  Essential hypertension -     hydrochlorothiazide (HYDRODIURIL) 25 MG tablet; Take 1 tablet (25 mg total) by mouth daily. -     losartan (COZAAR) 50 MG tablet; Take 1 tablet (50 mg total) by mouth  daily.    Patient Instructions       If you have lab work done today you will be contacted with your lab results within the next 2 weeks.  If you have not heard from [place] then please contact us. The fastest way to get your results is to register for My Chart.   IF you received an x-ray today, you will receive an invoice from [facility]. Please contact [facility] at [phone] with questions or concerns regarding your invoice.   IF you received labwork today, you will receive an invoice from [facility]. Please contact [facility] at [phone] with questions or concerns regarding your invoice.   Our billing staff will not be able to assist you with questions regarding bills from these companies.  You will be contacted with the lab results as soon as they are available. The fastest way to get your results is to activate your My Chart account. Instructions are located on the last page of this paperwork. If you have not heard from [place] regarding the results in 2 weeks, please contact this office.     Insomnia Insomnia is a sleep disorder that makes it difficult to fall asleep or stay asleep. Insomnia can cause fatigue, low energy, difficulty concentrating, mood swings, and poor performance at work or school. There are three different ways to classify insomnia:  Difficulty falling asleep.  Difficulty staying asleep.  Waking up too early in the morning. Any type of insomnia can be long-term (chronic) or short-term (acute). Both are common. Short-term insomnia usually lasts for three months or less. Chronic insomnia occurs at least three times a week for longer  than three months. What are the causes? Insomnia may be caused by another condition, situation, or substance, such as:  Anxiety.  Certain medicines.  Gastroesophageal reflux disease (GERD) or other gastrointestinal conditions.  Asthma or other breathing conditions.  Restless legs syndrome, sleep apnea, or  other sleep disorders.  Chronic pain.  Menopause.  Stroke.  Abuse of alcohol, tobacco, or illegal drugs.  Mental health conditions, such as depression.  Caffeine.  Neurological disorders, such as Alzheimer's disease.  An overactive thyroid (hyperthyroidism). Sometimes, the cause of insomnia may not be known. What increases the risk? Risk factors for insomnia include:  Gender. Women are affected more often than men.  Age. Insomnia is more common as you get older.  Stress.  Lack of exercise.  Irregular work schedule or working night shifts.  Traveling between different time zones.  Certain medical and mental health conditions. What are the signs or symptoms? If you have insomnia, the main symptom is having trouble falling asleep or having trouble staying asleep. This may lead to other symptoms, such as:  Feeling fatigued or having low energy.  Feeling nervous about going to sleep.  Not feeling rested in the morning.  Having trouble concentrating.  Feeling irritable, anxious, or depressed. How is this diagnosed? This condition may be diagnosed based on:  Your symptoms and medical history. Your health care provider may ask about: ? Your sleep habits. ? Any medical conditions you have. ? Your mental health.  A physical exam. How is this treated? Treatment for insomnia depends on the cause. Treatment may focus on treating an underlying condition that is causing insomnia. Treatment may also include:  Medicines to help you sleep.  Counseling or therapy.  Lifestyle adjustments to help you sleep better. Follow these instructions at home: Eating and drinking   Limit or avoid alcohol, caffeinated beverages, and cigarettes, especially close to bedtime. These can disrupt your sleep.  Do not eat a large meal or eat spicy foods right before bedtime. This can lead to digestive discomfort that can make it hard for you to sleep. Sleep habits   Keep a sleep diary  to help you and your health care provider figure out what could be causing your insomnia. Write down: ? When you sleep. ? When you wake up during the night. ? How well you sleep. ? How rested you feel the next day. ? Any side effects of medicines you are taking. ? What you eat and drink.  Make your bedroom a dark, comfortable place where it is easy to fall asleep. ? Put up shades or blackout curtains to block light from outside. ? Use a white noise machine to block noise. ? Keep the temperature cool.  Limit screen use before bedtime. This includes: ? Watching TV. ? Using your smartphone, tablet, or computer.  Stick to a routine that includes going to bed and waking up at the same times every day and night. This can help you fall asleep faster. Consider making a quiet activity, such as reading, part of your nighttime routine.  Try to avoid taking naps during the day so that you sleep better at night.  Get out of bed if you are still awake after 15 minutes of trying to sleep. Keep the lights down, but try reading or doing a quiet activity. When you feel sleepy, go back to bed. General instructions  Take over-the-counter and prescription medicines only as told by your health care provider.  Exercise regularly, as told by your health care  provider. Avoid exercise starting several hours before bedtime.  Use relaxation techniques to manage stress. Ask your health care provider to suggest some techniques that may work well for you. These may include: ? Breathing exercises. ? Routines to release muscle tension. ? Visualizing peaceful scenes.  Make sure that you drive carefully. Avoid driving if you feel very sleepy.  Keep all follow-up visits as told by your health care provider. This is important. Contact a health care provider if:  You are tired throughout the day.  You have trouble in your daily routine due to sleepiness.  You continue to have sleep problems, or your sleep  problems get worse. Get help right away if:  You have serious thoughts about hurting yourself or someone else. If you ever feel like you may hurt yourself or others, or have thoughts about taking your own life, get help right away. You can go to your nearest emergency department or call:  Your local emergency services (911 in the [place]  A suicide crisis helpline, such as the National Suicide Prevention Lifeline at 608-647-12641-(269) 771-9831. This is open 24 hours a day. Summary  Insomnia is a sleep disorder that makes it difficult to fall asleep or stay asleep.  Insomnia can be long-term (chronic) or short-term (acute).  Treatment for insomnia depends on the cause. Treatment may focus on treating an underlying condition that is causing insomnia.  Keep a sleep diary to help you and your health care provider figure out what could be causing your insomnia. This information is not intended to replace advice given to you by your health care provider. Make sure you discuss any questions you have with your health care provider. Document Released: 05/08/2000 Document Revised: 02/18/2017 Document Reviewed: 02/18/2017 Elsevier Interactive Patient Education  2019 [employer]  Hypertension Hypertension, commonly called high blood pressure, is when the force of blood pumping through the arteries is too strong. The arteries are the blood vessels that carry blood from the heart throughout the body. Hypertension forces the heart to work harder to pump blood and may cause arteries to become narrow or stiff. Having untreated or uncontrolled hypertension can cause heart attacks, strokes, kidney disease, and other problems. A blood pressure reading consists of a higher number over a lower number. Ideally, your blood pressure should be below 120/80. The first ("top") number is called the systolic pressure. It is a measure of the pressure in your arteries as your heart beats. The second ("bottom") number is called the  diastolic pressure. It is a measure of the pressure in your arteries as the heart relaxes. What are the causes? The cause of this condition is not known. What increases the risk? Some risk factors for high blood pressure are under your control. Others are not. Factors you can change  Smoking.  Having type 2 diabetes mellitus, high cholesterol, or both.  Not getting enough exercise or physical activity.  Being overweight.  Having too much fat, sugar, calories, or salt (sodium) in your diet.  Drinking too much alcohol. Factors that are difficult or impossible to change  Having chronic kidney disease.  Having a family history of high blood pressure.  Age. Risk increases with age.  Race. You may be at higher risk if you are African-American.  Gender. Men are at higher risk than women before age 48. After age 48, women are at higher risk than men.  Having obstructive sleep apnea.  Stress. What are the signs or symptoms? Extremely high blood pressure (hypertensive crisis) may  cause:  Headache.  Anxiety.  Shortness of breath.  Nosebleed.  Nausea and vomiting.  Severe chest pain.  Jerky movements you cannot control (seizures). How is this diagnosed? This condition is diagnosed by measuring your blood pressure while you are seated, with your arm resting on a surface. The cuff of the blood pressure monitor will be placed directly against the skin of your upper arm at the level of your heart. It should be measured at least twice using the same arm. Certain conditions can cause a difference in blood pressure between your right and left arms. Certain factors can cause blood pressure readings to be lower or higher than normal (elevated) for a short period of time:  When your blood pressure is higher when you are in a health care provider's office than when you are at home, this is called white coat hypertension. Most people with this condition do not need medicines.  When  your blood pressure is higher at home than when you are in a health care provider's office, this is called masked hypertension. Most people with this condition may need medicines to control blood pressure. If you have a high blood pressure reading during one visit or you have normal blood pressure with other risk factors:  You may be asked to return on a different day to have your blood pressure checked again.  You may be asked to monitor your blood pressure at home for 1 week or longer. If you are diagnosed with hypertension, you may have other blood or imaging tests to help your health care provider understand your overall risk for other conditions. How is this treated? This condition is treated by making healthy lifestyle changes, such as eating healthy foods, exercising more, and reducing your alcohol intake. Your health care provider may prescribe medicine if lifestyle changes are not enough to get your blood pressure under control, and if:  Your systolic blood pressure is above 130.  Your diastolic blood pressure is above 80. Your personal target blood pressure may vary depending on your medical conditions, your age, and other factors. Follow these instructions at home: Eating and drinking   Eat a diet that is high in fiber and potassium, and low in sodium, added sugar, and fat. An example eating plan is called the DASH (Dietary Approaches to Stop Hypertension) diet. To eat this way: ? Eat plenty of fresh fruits and vegetables. Try to fill half of your plate at each meal with fruits and vegetables. ? Eat whole grains, such as whole wheat pasta, brown rice, or whole grain bread. Fill about one quarter of your plate with whole grains. ? Eat or drink low-fat dairy products, such as skim milk or low-fat yogurt. ? Avoid fatty cuts of meat, processed or cured meats, and poultry with skin. Fill about one quarter of your plate with lean proteins, such as fish, chicken without skin, beans, eggs,  and tofu. ? Avoid premade and processed foods. These tend to be higher in sodium, added sugar, and fat.  Reduce your daily sodium intake. Most people with hypertension should eat less than 1,500 mg of sodium a day.  Limit alcohol intake to no more than 1 drink a day for nonpregnant women and 2 drinks a day for men. One drink equals 12 oz of beer, 5 oz of wine, or 1 oz of hard liquor. Lifestyle   Work with your health care provider to maintain a healthy body weight or to lose weight. Ask what an ideal weight  is for you.  Get at least 30 minutes of exercise that causes your heart to beat faster (aerobic exercise) most days of the week. Activities may include walking, swimming, or biking.  Include exercise to strengthen your muscles (resistance exercise), such as pilates or lifting weights, as part of your weekly exercise routine. Try to do these types of exercises for 30 minutes at least 3 days a week.  Do not use any products that contain nicotine or tobacco, such as cigarettes and e-cigarettes. If you need help quitting, ask your health care provider.  Monitor your blood pressure at home as told by your health care provider.  Keep all follow-up visits as told by your health care provider. This is important. Medicines  Take over-the-counter and prescription medicines only as told by your health care provider. Follow directions carefully. Blood pressure medicines must be taken as prescribed.  Do not skip doses of blood pressure medicine. Doing this puts you at risk for problems and can make the medicine less effective.  Ask your health care provider about side effects or reactions to medicines that you should watch for. Contact a health care provider if:  You think you are having a reaction to a medicine you are taking.  You have headaches that keep coming back (recurring).  You feel dizzy.  You have swelling in your ankles.  You have trouble with your vision. Get help right away  if:  You develop a severe headache or confusion.  You have unusual weakness or numbness.  You feel faint.  You have severe pain in your chest or abdomen.  You vomit repeatedly.  You have trouble breathing. Summary  Hypertension is when the force of blood pumping through your arteries is too strong. If this condition is not controlled, it may put you at risk for serious complications.  Your personal target blood pressure may vary depending on your medical conditions, your age, and other factors. For most people, a normal blood pressure is less than 120/80.  Hypertension is treated with lifestyle changes, medicines, or a combination of both. Lifestyle changes include weight loss, eating a healthy, low-sodium diet, exercising more, and limiting alcohol. This information is not intended to replace advice given to you by your health care provider. Make sure you discuss any questions you have with your health care provider. Document Released: 05/11/2005 Document Revised: 04/08/2016 Document Reviewed: 04/08/2016 Elsevier Interactive Patient Education  2019 [employer] Inc[employer]  Diabetes Mellitus and Nutrition, Adult When you have diabetes (diabetes mellitus), it is very important to have healthy eating habits because your blood sugar (glucose) levels are greatly affected by what you eat and drink. Eating healthy foods in the appropriate amounts, at about the same times every day, can help you:  Control your blood glucose.  Lower your risk of heart disease.  Improve your blood pressure.  Reach or maintain a healthy weight. Every person with diabetes is different, and each person has different needs for a meal plan. Your health care provider may recommend that you work with a diet and nutrition specialist (dietitian) to make a meal plan that is best for you. Your meal plan may vary depending on factors such as:  The calories you need.  The medicines you take.  Your weight.  Your blood  glucose, blood pressure, and cholesterol levels.  Your activity level.  Other health conditions you have, such as heart or kidney disease. How do carbohydrates affect me? Carbohydrates, also called carbs, affect your blood glucose level  more than any other type of food. Eating carbs naturally raises the amount of glucose in your blood. Carb counting is a method for keeping track of how many carbs you eat. Counting carbs is important to keep your blood glucose at a healthy level, especially if you use insulin or take certain oral diabetes medicines. It is important to know how many carbs you can safely have in each meal. This is different for every person. Your dietitian can help you calculate how many carbs you should have at each meal and for each snack. Foods that contain carbs include:  Bread, cereal, rice, pasta, and crackers.  Potatoes and corn.  Peas, beans, and lentils.  Milk and yogurt.  Fruit and juice.  Desserts, such as cakes, cookies, ice cream, and candy. How does alcohol affect me? Alcohol can cause a sudden decrease in blood glucose (hypoglycemia), especially if you use insulin or take certain oral diabetes medicines. Hypoglycemia can be a life-threatening condition. Symptoms of hypoglycemia (sleepiness, dizziness, and confusion) are similar to symptoms of having too much alcohol. If your health care provider says that alcohol is safe for you, follow these guidelines:  Limit alcohol intake to no more than 1 drink per day for nonpregnant women and 2 drinks per day for men. One drink equals 12 oz of beer, 5 oz of wine, or 1 oz of hard liquor.  Do not drink on an empty stomach.  Keep yourself hydrated with water, diet soda, or unsweetened iced tea.  Keep in mind that regular soda, juice, and other mixers may contain a lot of sugar and must be counted as carbs. What are tips for following this plan?  Reading food labels  Start by checking the serving size on the  "Nutrition Facts" label of packaged foods and drinks. The amount of calories, carbs, fats, and other nutrients listed on the label is based on one serving of the item. Many items contain more than one serving per package.  Check the total grams (g) of carbs in one serving. You can calculate the number of servings of carbs in one serving by dividing the total carbs by 15. For example, if a food has 30 g of total carbs, it would be equal to 2 servings of carbs.  Check the number of grams (g) of saturated and trans fats in one serving. Choose foods that have low or no amount of these fats.  Check the number of milligrams (mg) of salt (sodium) in one serving. Most people should limit total sodium intake to less than 2,300 mg per day.  Always check the nutrition information of foods labeled as "low-fat" or "nonfat". These foods may be higher in added sugar or refined carbs and should be avoided.  Talk to your dietitian to identify your daily goals for nutrients listed on the label. Shopping  Avoid buying canned, premade, or processed foods. These foods tend to be high in fat, sodium, and added sugar.  Shop around the outside edge of the grocery store. This includes fresh fruits and vegetables, bulk grains, fresh meats, and fresh dairy. Cooking  Use low-heat cooking methods, such as baking, instead of high-heat cooking methods like deep frying.  Cook using healthy oils, such as olive, canola, or sunflower oil.  Avoid cooking with butter, cream, or high-fat meats. Meal planning  Eat meals and snacks regularly, preferably at the same times every day. Avoid going long periods of time without eating.  Eat foods high in fiber, such as fresh  fruits, vegetables, beans, and whole grains. Talk to your dietitian about how many servings of carbs you can eat at each meal.  Eat 4-6 ounces (oz) of lean protein each day, such as lean meat, chicken, fish, eggs, or tofu. One oz of lean protein is equal  to: ? 1 oz of meat, chicken, or fish. ? 1 egg. ?  cup of tofu.  Eat some foods each day that contain healthy fats, such as avocado, nuts, seeds, and fish. Lifestyle  Check your blood glucose regularly.  Exercise regularly as told by your health care provider. This may include: ? 150 minutes of moderate-intensity or vigorous-intensity exercise each week. This could be brisk walking, biking, or water aerobics. ? Stretching and doing strength exercises, such as yoga or weightlifting, at least 2 times a week.  Take medicines as told by your health care provider.  Do not use any products that contain nicotine or tobacco, such as cigarettes and e-cigarettes. If you need help quitting, ask your health care provider.  Work with a Veterinary surgeoncounselor or diabetes educator to identify strategies to manage stress and any emotional and social challenges. Questions to ask a health care provider  Do I need to meet with a diabetes educator?  Do I need to meet with a dietitian?  What number can I call if I have questions?  When are the best times to check my blood glucose? Where to find more information:  American Diabetes Association: diabetes.org  Academy of Nutrition and Dietetics: www.eatright.[employer]  National Institute of Diabetes and Digestive and Kidney Diseases (NIH): Car[employer].tnwww.niddk.nih.gov Summary  A healthy meal plan will help you control your blood glucose and maintain a healthy lifestyle.  Working with a diet and nutrition specialist (dietitian) can help you make a meal plan that is best for you.  Keep in mind that carbohydrates (carbs) and alcohol have immediate effects on your blood glucose levels. It is important to count carbs and to use alcohol carefully. This information is not intended to replace advice given to you by your health care provider. Make sure you discuss any questions you have with your health care provider. Document Released: 02/05/2005 Document Revised: 12/09/2016 Document  Reviewed: 06/15/2016 Elsevier Interactive Patient Education  2019 [employer]      Edwina BarthMiguel Gryphon Vanderveen, MD [facility]

## 2018-05-12 NOTE — Assessment & Plan Note (Signed)
Blood pressure is a little high in the office today.  In home readings within normal limits.  No change in treatment.  Continue present medication.  Follow-up in 3 months.

## 2018-05-12 NOTE — Assessment & Plan Note (Signed)
Hemoglobin A1c at 7.2 better than last time.  No change in treatment.  Continue present medication.  Advised on diet and exercise.  Follow-up in 3 months.

## 2018-05-12 NOTE — Patient Instructions (Addendum)
   If you have lab work done today you will be contacted with your lab results within the next 2 weeks.  If you have not heard from us then please contact us. The fastest way to get your results is to register for My Chart.   IF you received an x-ray today, you will receive an invoice from [facility]. Please contact [facility] at [phone] with questions or concerns regarding your invoice.   IF you received labwork today, you will receive an invoice from [facility]. Please contact [facility] at [phone] with questions or concerns regarding your invoice.   Our billing staff will not be able to assist you with questions regarding bills from these companies.  You will be contacted with the lab results as soon as they are available. The fastest way to get your results is to activate your My Chart account. Instructions are located on the last page of this paperwork. If you have not heard from us regarding the results in 2 weeks, please contact this office.     Insomnia Insomnia is a sleep disorder that makes it difficult to fall asleep or stay asleep. Insomnia can cause fatigue, low energy, difficulty concentrating, mood swings, and poor performance at work or school. There are three different ways to classify insomnia:  Difficulty falling asleep.  Difficulty staying asleep.  Waking up too early in the morning. Any type of insomnia can be long-term (chronic) or short-term (acute). Both are common. Short-term insomnia usually lasts for three months or less. Chronic insomnia occurs at least three times a week for longer than three months. What are the causes? Insomnia may be caused by another condition, situation, or substance, such as:  Anxiety.  Certain medicines.  Gastroesophageal reflux disease (GERD) or other gastrointestinal conditions.  Asthma or other breathing conditions.  Restless legs syndrome, sleep apnea, or other sleep disorders.  Chronic  pain.  Menopause.  Stroke.  Abuse of alcohol, tobacco, or illegal drugs.  Mental health conditions, such as depression.  Caffeine.  Neurological disorders, such as Alzheimer's disease.  An overactive thyroid (hyperthyroidism). Sometimes, the cause of insomnia may not be known. What increases the risk? Risk factors for insomnia include:  Gender. Women are affected more often than men.  Age. Insomnia is more common as you get older.  Stress.  Lack of exercise.  Irregular work schedule or working night shifts.  Traveling between different time zones.  Certain medical and mental health conditions. What are the signs or symptoms? If you have insomnia, the main symptom is having trouble falling asleep or having trouble staying asleep. This may lead to other symptoms, such as:  Feeling fatigued or having low energy.  Feeling nervous about going to sleep.  Not feeling rested in the morning.  Having trouble concentrating.  Feeling irritable, anxious, or depressed. How is this diagnosed? This condition may be diagnosed based on:  Your symptoms and medical history. Your health care provider may ask about: ? Your sleep habits. ? Any medical conditions you have. ? Your mental health.  A physical exam. How is this treated? Treatment for insomnia depends on the cause. Treatment may focus on treating an underlying condition that is causing insomnia. Treatment may also include:  Medicines to help you sleep.  Counseling or therapy.  Lifestyle adjustments to help you sleep better. Follow these instructions at home: Eating and drinking   Limit or avoid alcohol, caffeinated beverages, and cigarettes, especially close to bedtime. These can disrupt your sleep.    Do not eat a large meal or eat spicy foods right before bedtime. This can lead to digestive discomfort that can make it hard for you to sleep. Sleep habits   Keep a sleep diary to help you and your health care  provider figure out what could be causing your insomnia. Write down: ? When you sleep. ? When you wake up during the night. ? How well you sleep. ? How rested you feel the next day. ? Any side effects of medicines you are taking. ? What you eat and drink.  Make your bedroom a dark, comfortable place where it is easy to fall asleep. ? Put up shades or blackout curtains to block light from outside. ? Use a white noise machine to block noise. ? Keep the temperature cool.  Limit screen use before bedtime. This includes: ? Watching TV. ? Using your smartphone, tablet, or computer.  Stick to a routine that includes going to bed and waking up at the same times every day and night. This can help you fall asleep faster. Consider making a quiet activity, such as reading, part of your nighttime routine.  Try to avoid taking naps during the day so that you sleep better at night.  Get out of bed if you are still awake after 15 minutes of trying to sleep. Keep the lights down, but try reading or doing a quiet activity. When you feel sleepy, go back to bed. General instructions  Take over-the-counter and prescription medicines only as told by your health care provider.  Exercise regularly, as told by your health care provider. Avoid exercise starting several hours before bedtime.  Use relaxation techniques to manage stress. Ask your health care provider to suggest some techniques that may work well for you. These may include: ? Breathing exercises. ? Routines to release muscle tension. ? Visualizing peaceful scenes.  Make sure that you drive carefully. Avoid driving if you feel very sleepy.  Keep all follow-up visits as told by your health care provider. This is important. Contact a health care provider if:  You are tired throughout the day.  You have trouble in your daily routine due to sleepiness.  You continue to have sleep problems, or your sleep problems get worse. Get help right  away if:  You have serious thoughts about hurting yourself or someone else. If you ever feel like you may hurt yourself or others, or have thoughts about taking your own life, get help right away. You can go to your nearest emergency department or call:  Your local emergency services (911 in the [place]  A suicide crisis helpline, such as the National Suicide Prevention Lifeline at 351-493-1641. This is open 24 hours a day. Summary  Insomnia is a sleep disorder that makes it difficult to fall asleep or stay asleep.  Insomnia can be long-term (chronic) or short-term (acute).  Treatment for insomnia depends on the cause. Treatment may focus on treating an underlying condition that is causing insomnia.  Keep a sleep diary to help you and your health care provider figure out what could be causing your insomnia. This information is not intended to replace advice given to you by your health care provider. Make sure you discuss any questions you have with your health care provider. Document Released: 05/08/2000 Document Revised: 02/18/2017 Document Reviewed: 02/18/2017 Elsevier Interactive Patient Education  2019 [employer].  Hypertension Hypertension, commonly called high blood pressure, is when the force of blood pumping through the arteries is too strong. The  arteries are the blood vessels that carry blood from the heart throughout the body. Hypertension forces the heart to work harder to pump blood and may cause arteries to become narrow or stiff. Having untreated or uncontrolled hypertension can cause heart attacks, strokes, kidney disease, and other problems. A blood pressure reading consists of a higher number over a lower number. Ideally, your blood pressure should be below 120/80. The first ("top") number is called the systolic pressure. It is a measure of the pressure in your arteries as your heart beats. The second ("bottom") number is called the diastolic pressure. It is a measure of  the pressure in your arteries as the heart relaxes. What are the causes? The cause of this condition is not known. What increases the risk? Some risk factors for high blood pressure are under your control. Others are not. Factors you can change  Smoking.  Having type 2 diabetes mellitus, high cholesterol, or both.  Not getting enough exercise or physical activity.  Being overweight.  Having too much fat, sugar, calories, or salt (sodium) in your diet.  Drinking too much alcohol. Factors that are difficult or impossible to change  Having chronic kidney disease.  Having a family history of high blood pressure.  Age. Risk increases with age.  Race. You may be at higher risk if you are African-American.  Gender. Men are at higher risk than women before age 83. After age 3, women are at higher risk than men.  Having obstructive sleep apnea.  Stress. What are the signs or symptoms? Extremely high blood pressure (hypertensive crisis) may cause:  Headache.  Anxiety.  Shortness of breath.  Nosebleed.  Nausea and vomiting.  Severe chest pain.  Jerky movements you cannot control (seizures). How is this diagnosed? This condition is diagnosed by measuring your blood pressure while you are seated, with your arm resting on a surface. The cuff of the blood pressure monitor will be placed directly against the skin of your upper arm at the level of your heart. It should be measured at least twice using the same arm. Certain conditions can cause a difference in blood pressure between your right and left arms. Certain factors can cause blood pressure readings to be lower or higher than normal (elevated) for a short period of time:  When your blood pressure is higher when you are in a health care provider's office than when you are at home, this is called white coat hypertension. Most people with this condition do not need medicines.  When your blood pressure is higher at home than  when you are in a health care provider's office, this is called masked hypertension. Most people with this condition may need medicines to control blood pressure. If you have a high blood pressure reading during one visit or you have normal blood pressure with other risk factors:  You may be asked to return on a different day to have your blood pressure checked again.  You may be asked to monitor your blood pressure at home for 1 week or longer. If you are diagnosed with hypertension, you may have other blood or imaging tests to help your health care provider understand your overall risk for other conditions. How is this treated? This condition is treated by making healthy lifestyle changes, such as eating healthy foods, exercising more, and reducing your alcohol intake. Your health care provider may prescribe medicine if lifestyle changes are not enough to get your blood pressure under control, and if:  Your  systolic blood pressure is above 130.  Your diastolic blood pressure is above 80. Your personal target blood pressure may vary depending on your medical conditions, your age, and other factors. Follow these instructions at home: Eating and drinking   Eat a diet that is high in fiber and potassium, and low in sodium, added sugar, and fat. An example eating plan is called the DASH (Dietary Approaches to Stop Hypertension) diet. To eat this way: ? Eat plenty of fresh fruits and vegetables. Try to fill half of your plate at each meal with fruits and vegetables. ? Eat whole grains, such as whole wheat pasta, brown rice, or whole grain bread. Fill about one quarter of your plate with whole grains. ? Eat or drink low-fat dairy products, such as skim milk or low-fat yogurt. ? Avoid fatty cuts of meat, processed or cured meats, and poultry with skin. Fill about one quarter of your plate with lean proteins, such as fish, chicken without skin, beans, eggs, and tofu. ? Avoid premade and processed  foods. These tend to be higher in sodium, added sugar, and fat.  Reduce your daily sodium intake. Most people with hypertension should eat less than 1,500 mg of sodium a day.  Limit alcohol intake to no more than 1 drink a day for nonpregnant women and 2 drinks a day for men. One drink equals 12 oz of beer, 5 oz of wine, or 1 oz of hard liquor. Lifestyle   Work with your health care provider to maintain a healthy body weight or to lose weight. Ask what an ideal weight is for you.  Get at least 30 minutes of exercise that causes your heart to beat faster (aerobic exercise) most days of the week. Activities may include walking, swimming, or biking.  Include exercise to strengthen your muscles (resistance exercise), such as pilates or lifting weights, as part of your weekly exercise routine. Try to do these types of exercises for 30 minutes at least 3 days a week.  Do not use any products that contain nicotine or tobacco, such as cigarettes and e-cigarettes. If you need help quitting, ask your health care provider.  Monitor your blood pressure at home as told by your health care provider.  Keep all follow-up visits as told by your health care provider. This is important. Medicines  Take over-the-counter and prescription medicines only as told by your health care provider. Follow directions carefully. Blood pressure medicines must be taken as prescribed.  Do not skip doses of blood pressure medicine. Doing this puts you at risk for problems and can make the medicine less effective.  Ask your health care provider about side effects or reactions to medicines that you should watch for. Contact a health care provider if:  You think you are having a reaction to a medicine you are taking.  You have headaches that keep coming back (recurring).  You feel dizzy.  You have swelling in your ankles.  You have trouble with your vision. Get help right away if:  You develop a severe headache or  confusion.  You have unusual weakness or numbness.  You feel faint.  You have severe pain in your chest or abdomen.  You vomit repeatedly.  You have trouble breathing. Summary  Hypertension is when the force of blood pumping through your arteries is too strong. If this condition is not controlled, it may put you at risk for serious complications.  Your personal target blood pressure may vary depending on your medical  conditions, your age, and other factors. For most people, a normal blood pressure is less than 120/80.  Hypertension is treated with lifestyle changes, medicines, or a combination of both. Lifestyle changes include weight loss, eating a healthy, low-sodium diet, exercising more, and limiting alcohol. This information is not intended to replace advice given to you by your health care provider. Make sure you discuss any questions you have with your health care provider. Document Released: 05/11/2005 Document Revised: 04/08/2016 Document Reviewed: 04/08/2016 Elsevier Interactive Patient Education  2019 Else[employer] Inc[employer]  Diabetes Mellitus and Nutrition, Adult When you have diabetes (diabetes mellitus), it is very important to have healthy eating habits because your blood sugar (glucose) levels are greatly affected by what you eat and drink. Eating healthy foods in the appropriate amounts, at about the same times every day, can help you:  Control your blood glucose.  Lower your risk of heart disease.  Improve your blood pressure.  Reach or maintain a healthy weight. Every person with diabetes is different, and each person has different needs for a meal plan. Your health care provider may recommend that you work with a diet and nutrition specialist (dietitian) to make a meal plan that is best for you. Your meal plan may vary depending on factors such as:  The calories you need.  The medicines you take.  Your weight.  Your blood glucose, blood pressure, and cholesterol  levels.  Your activity level.  Other health conditions you have, such as heart or kidney disease. How do carbohydrates affect me? Carbohydrates, also called carbs, affect your blood glucose level more than any other type of food. Eating carbs naturally raises the amount of glucose in your blood. Carb counting is a method for keeping track of how many carbs you eat. Counting carbs is important to keep your blood glucose at a healthy level, especially if you use insulin or take certain oral diabetes medicines. It is important to know how many carbs you can safely have in each meal. This is different for every person. Your dietitian can help you calculate how many carbs you should have at each meal and for each snack. Foods that contain carbs include:  Bread, cereal, rice, pasta, and crackers.  Potatoes and corn.  Peas, beans, and lentils.  Milk and yogurt.  Fruit and juice.  Desserts, such as cakes, cookies, ice cream, and candy. How does alcohol affect me? Alcohol can cause a sudden decrease in blood glucose (hypoglycemia), especially if you use insulin or take certain oral diabetes medicines. Hypoglycemia can be a life-threatening condition. Symptoms of hypoglycemia (sleepiness, dizziness, and confusion) are similar to symptoms of having too much alcohol. If your health care provider says that alcohol is safe for you, follow these guidelines:  Limit alcohol intake to no more than 1 drink per day for nonpregnant women and 2 drinks per day for men. One drink equals 12 oz of beer, 5 oz of wine, or 1 oz of hard liquor.  Do not drink on an empty stomach.  Keep yourself hydrated with water, diet soda, or unsweetened iced tea.  Keep in mind that regular soda, juice, and other mixers may contain a lot of sugar and must be counted as carbs. What are tips for following this plan?  Reading food labels  Start by checking the serving size on the "Nutrition Facts" label of packaged foods and  drinks. The amount of calories, carbs, fats, and other nutrients listed on the label is based on one serving  of the item. Many items contain more than one serving per package.  Check the total grams (g) of carbs in one serving. You can calculate the number of servings of carbs in one serving by dividing the total carbs by 15. For example, if a food has 30 g of total carbs, it would be equal to 2 servings of carbs.  Check the number of grams (g) of saturated and trans fats in one serving. Choose foods that have low or no amount of these fats.  Check the number of milligrams (mg) of salt (sodium) in one serving. Most people should limit total sodium intake to less than 2,300 mg per day.  Always check the nutrition information of foods labeled as "low-fat" or "nonfat". These foods may be higher in added sugar or refined carbs and should be avoided.  Talk to your dietitian to identify your daily goals for nutrients listed on the label. Shopping  Avoid buying canned, premade, or processed foods. These foods tend to be high in fat, sodium, and added sugar.  Shop around the outside edge of the grocery store. This includes fresh fruits and vegetables, bulk grains, fresh meats, and fresh dairy. Cooking  Use low-heat cooking methods, such as baking, instead of high-heat cooking methods like deep frying.  Cook using healthy oils, such as olive, canola, or sunflower oil.  Avoid cooking with butter, cream, or high-fat meats. Meal planning  Eat meals and snacks regularly, preferably at the same times every day. Avoid going long periods of time without eating.  Eat foods high in fiber, such as fresh fruits, vegetables, beans, and whole grains. Talk to your dietitian about how many servings of carbs you can eat at each meal.  Eat 4-6 ounces (oz) of lean protein each day, such as lean meat, chicken, fish, eggs, or tofu. One oz of lean protein is equal to: ? 1 oz of meat, chicken, or fish. ? 1 egg. ?   cup of tofu.  Eat some foods each day that contain healthy fats, such as avocado, nuts, seeds, and fish. Lifestyle  Check your blood glucose regularly.  Exercise regularly as told by your health care provider. This may include: ? 150 minutes of moderate-intensity or vigorous-intensity exercise each week. This could be brisk walking, biking, or water aerobics. ? Stretching and doing strength exercises, such as yoga or weightlifting, at least 2 times a week.  Take medicines as told by your health care provider.  Do not use any products that contain nicotine or tobacco, such as cigarettes and e-cigarettes. If you need help quitting, ask your health care provider.  Work with a Veterinary surgeon or diabetes educator to identify strategies to manage stress and any emotional and social challenges. Questions to ask a health care provider  Do I need to meet with a diabetes educator?  Do I need to meet with a dietitian?  What number can I call if I have questions?  When are the best times to check my blood glucose? Where to find more information:  American Diabetes Association: diabetes.org  Academy of Nutrition and Dietetics: www.eatright.[employer] of Diabetes and Digestive and Kidney Diseases (NIH): CarFli[employer].tn Summary  A healthy meal plan will help you control your blood glucose and maintain a healthy lifestyle.  Working with a diet and nutrition specialist (dietitian) can help you make a meal plan that is best for you.  Keep in mind that carbohydrates (carbs) and alcohol have immediate effects on your blood glucose levels.  It is important to count carbs and to use alcohol carefully. This information is not intended to replace advice given to you by your health care provider. Make sure you discuss any questions you have with your health care provider. Document Released: 02/05/2005 Document Revised: 12/09/2016 Document Reviewed: 06/15/2016 Elsevier Interactive Patient  Education  2019 [employer].

## 2018-05-13 LAB — COMPREHENSIVE METABOLIC PANEL
A/G RATIO: 1.8 (ref 1.2–2.2)
ALT: 18 IU/L (ref 0–32)
AST: 14 IU/L (ref 0–40)
Albumin: 4.7 g/dL (ref 3.5–5.5)
Alkaline Phosphatase: 107 IU/L (ref 39–117)
BUN/Creatinine Ratio: 16 (ref 9–23)
BUN: 14 mg/dL (ref 6–24)
Bilirubin Total: 0.3 mg/dL (ref 0.0–1.2)
CALCIUM: 9.7 mg/dL (ref 8.7–10.2)
CO2: 21 mmol/L (ref 20–29)
CREATININE: 0.86 mg/dL (ref 0.57–1.00)
Chloride: 98 mmol/L (ref 96–106)
GFR, EST AFRICAN AMERICAN: 92 mL/min/{1.73_m2} (ref >59)
GFR, EST NON AFRICAN AMERICAN: 80 mL/min/{1.73_m2} (ref >59)
GLOBULIN, TOTAL: 2.6 g/dL (ref 1.5–4.5)
Glucose: 185 mg/dL — ABNORMAL HIGH (ref 65–99)
Potassium: 3.8 mmol/L (ref 3.5–5.2)
Sodium: 137 mmol/L (ref 134–144)
TOTAL PROTEIN: 7.3 g/dL (ref 6.0–8.5)

## 2018-05-13 LAB — CBC
HEMOGLOBIN: 16.7 g/dL — AB (ref 11.1–15.9)
Hematocrit: 48.4 % — ABNORMAL HIGH (ref 34.0–46.6)
MCH: 29.1 pg (ref 26.6–33.0)
MCHC: 34.5 g/dL (ref 31.5–35.7)
MCV: 85 fL (ref 79–97)
Platelets: 211 10*3/uL (ref 150–450)
RBC: 5.73 x10E6/uL — ABNORMAL HIGH (ref 3.77–5.28)
RDW: 13.1 % (ref 12.3–15.4)
WBC: 8.5 10*3/uL (ref 3.4–10.8)

## 2018-05-13 LAB — LIPID PANEL
CHOL/HDL RATIO: 4 ratio (ref 0.0–4.4)
CHOLESTEROL TOTAL: 175 mg/dL (ref 100–199)
HDL: 44 mg/dL (ref >39)
LDL CALC: 88 mg/dL (ref 0–99)
Triglycerides: 214 mg/dL — ABNORMAL HIGH (ref 0–149)
VLDL CHOLESTEROL CAL: 43 mg/dL — AB (ref 5–40)

## 2018-06-20 ENCOUNTER — Other Ambulatory Visit: Payer: Self-pay | Admitting: Physician Assistant

## 2018-06-20 DIAGNOSIS — E119 Type 2 diabetes mellitus without complications: Secondary | ICD-10-CM

## 2018-06-21 ENCOUNTER — Telehealth: Payer: Self-pay | Admitting: *Deleted

## 2018-06-21 NOTE — Telephone Encounter (Signed)
Cancelled rx for Atorvastatin 20 mg, pt stated that she have already picked up a 90 day supply.

## 2018-06-21 NOTE — Telephone Encounter (Signed)
Pt requesting Zolpidem 10 mg. She would like to have #30 instead of #15 tablets. She has an appointment scheduled for 08/11/2018 @ 8:40 am. She would like enough until appt. pls advise

## 2018-06-22 ENCOUNTER — Other Ambulatory Visit: Payer: Self-pay | Admitting: Emergency Medicine

## 2018-06-22 DIAGNOSIS — G4700 Insomnia, unspecified: Secondary | ICD-10-CM

## 2018-06-22 MED ORDER — ZOLPIDEM TARTRATE 10 MG PO TABS: 10.0000 mg | ORAL_TABLET | Freq: Every evening | ORAL | 0 refills | Status: DC | PRN

## 2018-06-22 NOTE — Progress Notes (Unsigned)
,   Tylenol

## 2018-06-22 NOTE — Telephone Encounter (Signed)
Advise patient not to take it every night.  Will send prescription for.  Thanks.

## 2018-06-25 NOTE — Telephone Encounter (Signed)
Patient advised not to take medication every night.

## 2018-08-11 ENCOUNTER — Ambulatory Visit: Payer: 59 | Admitting: Emergency Medicine

## 2018-08-11 ENCOUNTER — Encounter: Payer: Self-pay | Admitting: Emergency Medicine

## 2018-08-11 ENCOUNTER — Other Ambulatory Visit: Payer: Self-pay

## 2018-08-11 VITALS — BP 170/80 | HR 113 | Temp 98.3°F | Ht 68.0 in | Wt 265.8 lb

## 2018-08-11 DIAGNOSIS — B351 Tinea unguium: Secondary | ICD-10-CM | POA: Insufficient documentation

## 2018-08-11 DIAGNOSIS — Z6841 Body Mass Index (BMI) 40.0 and over, adult: Secondary | ICD-10-CM

## 2018-08-11 DIAGNOSIS — I10 Essential (primary) hypertension: Secondary | ICD-10-CM | POA: Diagnosis not present

## 2018-08-11 DIAGNOSIS — E1159 Type 2 diabetes mellitus with other circulatory complications: Secondary | ICD-10-CM

## 2018-08-11 DIAGNOSIS — E1165 Type 2 diabetes mellitus with hyperglycemia: Secondary | ICD-10-CM

## 2018-08-11 DIAGNOSIS — G478 Other sleep disorders: Secondary | ICD-10-CM

## 2018-08-11 DIAGNOSIS — G4700 Insomnia, unspecified: Secondary | ICD-10-CM

## 2018-08-11 DIAGNOSIS — E1169 Type 2 diabetes mellitus with other specified complication: Secondary | ICD-10-CM

## 2018-08-11 DIAGNOSIS — E785 Hyperlipidemia, unspecified: Secondary | ICD-10-CM

## 2018-08-11 MED ORDER — LOSARTAN POTASSIUM-HCTZ 100-25 MG PO TABS: 1.0000 | ORAL_TABLET | Freq: Every day | ORAL | 3 refills | Status: DC

## 2018-08-11 MED ORDER — ZOLPIDEM TARTRATE 10 MG PO TABS: 10.0000 mg | ORAL_TABLET | Freq: Every evening | ORAL | 0 refills | Status: DC | PRN

## 2018-08-11 MED ORDER — TERBINAFINE HCL 250 MG PO TABS: 250.0000 mg | ORAL_TABLET | Freq: Every day | ORAL | 2 refills | Status: AC

## 2018-08-11 NOTE — Patient Instructions (Addendum)
   If you have lab work done today you will be contacted with your lab results within the next 2 weeks.  If you have not heard from us then please contact us. The fastest way to get your results is to register for My Chart.   IF you received an x-ray today, you will receive an invoice from [facility]. Please contact [facility] at [phone] with questions or concerns regarding your invoice.   IF you received labwork today, you will receive an invoice from [facility]. Please contact [facility] at [phone] with questions or concerns regarding your invoice.   Our billing staff will not be able to assist you with questions regarding bills from these companies.  You will be contacted with the lab results as soon as they are available. The fastest way to get your results is to activate your My Chart account. Instructions are located on the last page of this paperwork. If you have not heard from us regarding the results in 2 weeks, please contact this office.     Diabetes Mellitus and Nutrition, Adult When you have diabetes (diabetes mellitus), it is very important to have healthy eating habits because your blood sugar (glucose) levels are greatly affected by what you eat and drink. Eating healthy foods in the appropriate amounts, at about the same times every day, can help you:  Control your blood glucose.  Lower your risk of heart disease.  Improve your blood pressure.  Reach or maintain a healthy weight. Every person with diabetes is different, and each person has different needs for a meal plan. Your health care provider may recommend that you work with a diet and nutrition specialist (dietitian) to make a meal plan that is best for you. Your meal plan may vary depending on factors such as:  The calories you need.  The medicines you take.  Your weight.  Your blood glucose, blood pressure, and cholesterol levels.  Your activity level.  Other health conditions  you have, such as heart or kidney disease. How do carbohydrates affect me? Carbohydrates, also called carbs, affect your blood glucose level more than any other type of food. Eating carbs naturally raises the amount of glucose in your blood. Carb counting is a method for keeping track of how many carbs you eat. Counting carbs is important to keep your blood glucose at a healthy level, especially if you use insulin or take certain oral diabetes medicines. It is important to know how many carbs you can safely have in each meal. This is different for every person. Your dietitian can help you calculate how many carbs you should have at each meal and for each snack. Foods that contain carbs include:  Bread, cereal, rice, pasta, and crackers.  Potatoes and corn.  Peas, beans, and lentils.  Milk and yogurt.  Fruit and juice.  Desserts, such as cakes, cookies, ice cream, and candy. How does alcohol affect me? Alcohol can cause a sudden decrease in blood glucose (hypoglycemia), especially if you use insulin or take certain oral diabetes medicines. Hypoglycemia can be a life-threatening condition. Symptoms of hypoglycemia (sleepiness, dizziness, and confusion) are similar to symptoms of having too much alcohol. If your health care provider says that alcohol is safe for you, follow these guidelines:  Limit alcohol intake to no more than 1 drink per day for nonpregnant women and 2 drinks per day for men. One drink equals 12 oz of beer, 5 oz of wine, or 1 oz of hard liquor.    Do not drink on an empty stomach.  Keep yourself hydrated with water, diet soda, or unsweetened iced tea.  Keep in mind that regular soda, juice, and other mixers may contain a lot of sugar and must be counted as carbs. What are tips for following this plan?  Reading food labels  Start by checking the serving size on the "Nutrition Facts" label of packaged foods and drinks. The amount of calories, carbs, fats, and other  nutrients listed on the label is based on one serving of the item. Many items contain more than one serving per package.  Check the total grams (g) of carbs in one serving. You can calculate the number of servings of carbs in one serving by dividing the total carbs by 15. For example, if a food has 30 g of total carbs, it would be equal to 2 servings of carbs.  Check the number of grams (g) of saturated and trans fats in one serving. Choose foods that have low or no amount of these fats.  Check the number of milligrams (mg) of salt (sodium) in one serving. Most people should limit total sodium intake to less than 2,300 mg per day.  Always check the nutrition information of foods labeled as "low-fat" or "nonfat". These foods may be higher in added sugar or refined carbs and should be avoided.  Talk to your dietitian to identify your daily goals for nutrients listed on the label. Shopping  Avoid buying canned, premade, or processed foods. These foods tend to be high in fat, sodium, and added sugar.  Shop around the outside edge of the grocery store. This includes fresh fruits and vegetables, bulk grains, fresh meats, and fresh dairy. Cooking  Use low-heat cooking methods, such as baking, instead of high-heat cooking methods like deep frying.  Cook using healthy oils, such as olive, canola, or sunflower oil.  Avoid cooking with butter, cream, or high-fat meats. Meal planning  Eat meals and snacks regularly, preferably at the same times every day. Avoid going long periods of time without eating.  Eat foods high in fiber, such as fresh fruits, vegetables, beans, and whole grains. Talk to your dietitian about how many servings of carbs you can eat at each meal.  Eat 4-6 ounces (oz) of lean protein each day, such as lean meat, chicken, fish, eggs, or tofu. One oz of lean protein is equal to: ? 1 oz of meat, chicken, or fish. ? 1 egg. ?  cup of tofu.  Eat some foods each day that contain  healthy fats, such as avocado, nuts, seeds, and fish. Lifestyle  Check your blood glucose regularly.  Exercise regularly as told by your health care provider. This may include: ? 150 minutes of moderate-intensity or vigorous-intensity exercise each week. This could be brisk walking, biking, or water aerobics. ? Stretching and doing strength exercises, such as yoga or weightlifting, at least 2 times a week.  Take medicines as told by your health care provider.  Do not use any products that contain nicotine or tobacco, such as cigarettes and e-cigarettes. If you need help quitting, ask your health care provider.  Work with a counselor or diabetes educator to identify strategies to manage stress and any emotional and social challenges. Questions to ask a health care provider  Do I need to meet with a diabetes educator?  Do I need to meet with a dietitian?  What number can I call if I have questions?  When are the best times to   check my blood glucose? Where to find more information:  American Diabetes Association: diabetes.org  Academy of Nutrition and Dietetics: www.eatright.org  National Institute of Diabetes and Digestive and Kidney Diseases (NIH): www.niddk.nih.gov Summary  A healthy meal plan will help you control your blood glucose and maintain a healthy lifestyle.  Working with a diet and nutrition specialist (dietitian) can help you make a meal plan that is best for you.  Keep in mind that carbohydrates (carbs) and alcohol have immediate effects on your blood glucose levels. It is important to count carbs and to use alcohol carefully. This information is not intended to replace advice given to you by your health care provider. Make sure you discuss any questions you have with your health care provider. Document Released: 02/05/2005 Document Revised: 12/09/2016 Document Reviewed: 06/15/2016 Elsevier Interactive Patient Education  2019 [employer]  Inc[employer]  Hypertension Hypertension, commonly called high blood pressure, is when the force of blood pumping through the arteries is too strong. The arteries are the blood vessels that carry blood from the heart throughout the body. Hypertension forces the heart to work harder to pump blood and may cause arteries to become narrow or stiff. Having untreated or uncontrolled hypertension can cause heart attacks, strokes, kidney disease, and other problems. A blood pressure reading consists of a higher number over a lower number. Ideally, your blood pressure should be below 120/80. The first ("top") number is called the systolic pressure. It is a measure of the pressure in your arteries as your heart beats. The second ("bottom") number is called the diastolic pressure. It is a measure of the pressure in your arteries as the heart relaxes. What are the causes? The cause of this condition is not known. What increases the risk? Some risk factors for high blood pressure are under your control. Others are not. Factors you can change  Smoking.  Having type 2 diabetes mellitus, high cholesterol, or both.  Not getting enough exercise or physical activity.  Being overweight.  Having too much fat, sugar, calories, or salt (sodium) in your diet.  Drinking too much alcohol. Factors that are difficult or impossible to change  Having chronic kidney disease.  Having a family history of high blood pressure.  Age. Risk increases with age.  Race. You may be at higher risk if you are African-American.  Gender. Men are at higher risk than women before age 45. After age 65, women are at higher risk than men.  Having obstructive sleep apnea.  Stress. What are the signs or symptoms? Extremely high blood pressure (hypertensive crisis) may cause:  Headache.  Anxiety.  Shortness of breath.  Nosebleed.  Nausea and vomiting.  Severe chest pain.  Jerky movements you cannot control (seizures). How is  this diagnosed? This condition is diagnosed by measuring your blood pressure while you are seated, with your arm resting on a surface. The cuff of the blood pressure monitor will be placed directly against the skin of your upper arm at the level of your heart. It should be measured at least twice using the same arm. Certain conditions can cause a difference in blood pressure between your right and left arms. Certain factors can cause blood pressure readings to be lower or higher than normal (elevated) for a short period of time:  When your blood pressure is higher when you are in a health care provider's office than when you are at home, this is called white coat hypertension. Most people with this condition do not need medicines.    When your blood pressure is higher at home than when you are in a health care provider's office, this is called masked hypertension. Most people with this condition may need medicines to control blood pressure. If you have a high blood pressure reading during one visit or you have normal blood pressure with other risk factors:  You may be asked to return on a different day to have your blood pressure checked again.  You may be asked to monitor your blood pressure at home for 1 week or longer. If you are diagnosed with hypertension, you may have other blood or imaging tests to help your health care provider understand your overall risk for other conditions. How is this treated? This condition is treated by making healthy lifestyle changes, such as eating healthy foods, exercising more, and reducing your alcohol intake. Your health care provider may prescribe medicine if lifestyle changes are not enough to get your blood pressure under control, and if:  Your systolic blood pressure is above 130.  Your diastolic blood pressure is above 80. Your personal target blood pressure may vary depending on your medical conditions, your age, and other factors. Follow these  instructions at home: Eating and drinking   Eat a diet that is high in fiber and potassium, and low in sodium, added sugar, and fat. An example eating plan is called the DASH (Dietary Approaches to Stop Hypertension) diet. To eat this way: ? Eat plenty of fresh fruits and vegetables. Try to fill half of your plate at each meal with fruits and vegetables. ? Eat whole grains, such as whole wheat pasta, brown rice, or whole grain bread. Fill about one quarter of your plate with whole grains. ? Eat or drink low-fat dairy products, such as skim milk or low-fat yogurt. ? Avoid fatty cuts of meat, processed or cured meats, and poultry with skin. Fill about one quarter of your plate with lean proteins, such as fish, chicken without skin, beans, eggs, and tofu. ? Avoid premade and processed foods. These tend to be higher in sodium, added sugar, and fat.  Reduce your daily sodium intake. Most people with hypertension should eat less than 1,500 mg of sodium a day.  Limit alcohol intake to no more than 1 drink a day for nonpregnant women and 2 drinks a day for men. One drink equals 12 oz of beer, 5 oz of wine, or 1 oz of hard liquor. Lifestyle   Work with your health care provider to maintain a healthy body weight or to lose weight. Ask what an ideal weight is for you.  Get at least 30 minutes of exercise that causes your heart to beat faster (aerobic exercise) most days of the week. Activities may include walking, swimming, or biking.  Include exercise to strengthen your muscles (resistance exercise), such as pilates or lifting weights, as part of your weekly exercise routine. Try to do these types of exercises for 30 minutes at least 3 days a week.  Do not use any products that contain nicotine or tobacco, such as cigarettes and e-cigarettes. If you need help quitting, ask your health care provider.  Monitor your blood pressure at home as told by your health care provider.  Keep all follow-up  visits as told by your health care provider. This is important. Medicines  Take over-the-counter and prescription medicines only as told by your health care provider. Follow directions carefully. Blood pressure medicines must be taken as prescribed.  Do not skip doses of blood pressure   medicine. Doing this puts you at risk for problems and can make the medicine less effective.  Ask your health care provider about side effects or reactions to medicines that you should watch for. Contact a health care provider if:  You think you are having a reaction to a medicine you are taking.  You have headaches that keep coming back (recurring).  You feel dizzy.  You have swelling in your ankles.  You have trouble with your vision. Get help right away if:  You develop a severe headache or confusion.  You have unusual weakness or numbness.  You feel faint.  You have severe pain in your chest or abdomen.  You vomit repeatedly.  You have trouble breathing. Summary  Hypertension is when the force of blood pumping through your arteries is too strong. If this condition is not controlled, it may put you at risk for serious complications.  Your personal target blood pressure may vary depending on your medical conditions, your age, and other factors. For most people, a normal blood pressure is less than 120/80.  Hypertension is treated with lifestyle changes, medicines, or a combination of both. Lifestyle changes include weight loss, eating a healthy, low-sodium diet, exercising more, and limiting alcohol. This information is not intended to replace advice given to you by your health care provider. Make sure you discuss any questions you have with your health care provider. Document Released: 05/11/2005 Document Revised: 04/08/2016 Document Reviewed: 04/08/2016 Elsevier Interactive Patient Education  2019 [employer] Inc.  

## 2018-08-11 NOTE — Assessment & Plan Note (Signed)
Recurrent.  Unresponsive to over-the-counter medication.  Will try Lamisil daily for 2 weeks.  If successful may continue for 2-4 more weeks.

## 2018-08-11 NOTE — Assessment & Plan Note (Signed)
Presently using Ambien 2-3 times a week.  Will refill medication.  Advised not to take it daily.

## 2018-08-11 NOTE — Assessment & Plan Note (Addendum)
Diabetes control.  Diet and nutrition.  Exercise.  Blood pressure control.

## 2018-08-11 NOTE — Assessment & Plan Note (Signed)
Patient intolerant to metformin.  Severe GI side effects.  On Farxiga 10 mg a day and also taking Lipitor 20 mg a day.  Continue present medications.  Follow-up in 6 months.

## 2018-08-11 NOTE — Assessment & Plan Note (Signed)
Uncontrolled hypertension.  Will change medication to Hyzaar 100/25 once a day.  Follow-up in 6 months.

## 2018-08-11 NOTE — Progress Notes (Addendum)
Lab Results  Component Value Date   HGBA1C 7.2 (A) 05/12/2018   BP Readings from Last 3 Encounters:  08/11/18 (!) 166/96  05/12/18 (!) 163/88  09/30/17 (!) 153/97   Lab Results  Component Value Date   CHOL 175 05/12/2018   HDL 44 05/12/2018   LDLCALC 88 05/12/2018   TRIG 214 (H) 05/12/2018   CHOLHDL 4.0 05/12/2018   Lab Results  Component Value Date   CREATININE 0.86 05/12/2018   BUN 14 05/12/2018   NA 137 05/12/2018   K 3.8 05/12/2018   CL 98 05/12/2018   CO2 21 05/12/2018   [name] 47 y.o.  The 10-year ASCVD risk score Denman George DC Montez Hageman., et al., 2013) is: 14.8%   Values used to calculate the score:     Age: 61 years     Sex: Female     Is Non-Hispanic African American: No     Diabetic: Yes     Tobacco smoker: Yes     Systolic Blood Pressure: 166 mmHg     Is BP treated: Yes     HDL Cholesterol: 44 mg/dL     Total Cholesterol: 175 mg/dL  Chief Complaint  Patient presents with  . Medication Refill    all med refills. currently out of Ambien   . Chronic conditions    f/u     HISTORY OF PRESENT ILLNESS: This is a 48 y.o. female with history of hypertension and diabetes here for follow-up and medication refills. Also complaining of fungal infection to right big toe for 1 to 2 years.  Has failed over-the-counter medication. Uses Ambien 2-3 times a week.  Requesting refill. No other complaints or medical concerns today. Blood pressure readings at home have been systolics between 130 and 140 and diastolics between 80 and 90.  Does not take her blood pressure frequently however. HPI   Prior to Admission medications   Medication Sig Start Date End Date Taking? Authorizing Provider  atorvastatin (LIPITOR) 20 MG tablet TAKE 1 TABLET BY MOUTH EVERY DAY 06/21/18  Yes Myles Lipps, MD  etonogestrel (IMPLANON) 68 MG IMPL implant Inject 1 each into the skin once.   Yes [provider]  hydrochlorothiazide (HYDRODIURIL) 25 MG tablet Take 1 tablet (25 mg  total) by mouth daily. 05/12/18  Yes Blessing Zaucha, Eilleen Kempf, MD  losartan (COZAAR) 50 MG tablet Take 1 tablet (50 mg total) by mouth daily. 05/12/18  Yes Jannelly Bergren, Eilleen Kempf, MD  zolpidem (AMBIEN) 10 MG tablet Take 1 tablet (10 mg total) by mouth at bedtime as needed for sleep (Do not take every night, only as needed.). 06/22/18  Yes Elayna Tobler, Eilleen Kempf, MD    No Known Allergies  Patient Active Problem List   Diagnosis Date Noted  . PVFS (postviral fatigue syndrome) 09/30/2017  . Type 2 diabetes mellitus with complication, without long-term current use of insulin (HCC) 09/30/2017  . Poor sleep pattern 09/30/2017  . Obesity (BMI 35.0-39.9 without comorbidity) 09/30/2017  . Type 2 diabetes mellitus without complication, without long-term current use of insulin (HCC) 06/25/2017  . Essential hypertension 03/22/2007    Past Medical History:  Diagnosis Date  . Allergy    seasonal  . Diabetes mellitus without complication (HCC)   . Hyperlipidemia   . Hypertension     Past Surgical History:  Procedure Laterality Date  . TONSILLECTOMY    . WISDOM TOOTH EXTRACTION      Social History   Socioeconomic History  . Marital status: Married  Spouse name: Not on file  . Number of children: Not on file  . Years of education: Not on file  . Highest education level: Not on file  Occupational History  . Not on file  Social Needs  . Financial resource strain: Not on file  . Food insecurity:    Worry: Not on file    Inability: Not on file  . Transportation needs:    Medical: Not on file    Non-medical: Not on file  Tobacco Use  . Smoking status: Current Every Day Smoker    Packs/day: 0.50    Last attempt to quit: 08/23/2003    Years since quitting: 14.9  . Smokeless tobacco: Never Used  Substance and Sexual Activity  . Alcohol use: Yes    Alcohol/week: 0.0 standard drinks    Comment: occasionally  . Drug use: No  . Sexual activity: Yes    Birth control/protection: Implant   Lifestyle  . Physical activity:    Days per week: Not on file    Minutes per session: Not on file  . Stress: Not on file  Relationships  . Social connections:    Talks on phone: Not on file    Gets together: Not on file    Attends religious service: Not on file    Active member of club or organization: Not on file    Attends meetings of clubs or organizations: Not on file    Relationship status: Not on file  . Intimate partner violence:    Fear of current or ex partner: Not on file    Emotionally abused: Not on file    Physically abused: Not on file    Forced sexual activity: Not on file  Other Topics Concern  . Not on file  Social History Narrative  . Not on file    Family History  Problem Relation Age of Onset  . Diabetes Maternal Grandmother   . Colon cancer Mother      Review of Systems  Constitutional: Negative.  Negative for chills and fever.  HENT: Negative.   Eyes: Negative.  Negative for blurred vision.  Respiratory: Negative.  Negative for cough and shortness of breath.   Cardiovascular: Negative.  Negative for chest pain and palpitations.  Gastrointestinal: Negative for abdominal pain, diarrhea, nausea and vomiting.  Genitourinary: Negative.  Negative for dysuria.  Musculoskeletal: Negative.   Skin: Negative.  Negative for rash.  Neurological: Negative.  Negative for dizziness and headaches.  Endo/Heme/Allergies: Negative.   All other systems reviewed and are negative.  Vitals:   08/11/18 0825  BP: (!) 166/96  Pulse: (!) 113  Temp: 98.3 F (36.8 C)  SpO2: 97%     Physical Exam Vitals signs reviewed.  Constitutional:      Appearance: Normal appearance.  HENT:     Head: Normocephalic and atraumatic.     Mouth/Throat:     Mouth: Mucous membranes are moist.     Pharynx: Oropharynx is clear.  Eyes:     Extraocular Movements: Extraocular movements intact.     Conjunctiva/sclera: Conjunctivae normal.     Pupils: Pupils are equal, round, and  reactive to light.  Neck:     Musculoskeletal: Normal range of motion and neck supple.  Cardiovascular:     Rate and Rhythm: Normal rate and regular rhythm.     Heart sounds: Normal heart sounds.  Pulmonary:     Effort: Pulmonary effort is normal.     Breath sounds: Normal breath sounds.  Musculoskeletal: Normal range of motion.  Skin:    General: Skin is warm and dry.     Capillary Refill: Capillary refill takes less than 2 seconds.     Comments: Right big toe: Positive onychomycosis.  Neurological:     General: No focal deficit present.     Mental Status: She is alert and oriented to person, place, and time.  Psychiatric:        Mood and Affect: Mood normal.        Behavior: Behavior normal.    A total of 40 minutes was spent in the room with the patient, greater than 50% of which was in counseling/coordination of care regarding chronic medical problems, treatment approaches, medications, diet and nutrition, prognosis, and need for follow-up.   ASSESSMENT & PLAN: Uncontrolled hypertension Uncontrolled hypertension.  Will change medication to Hyzaar 100/25 once a day.  Follow-up in 6 months.  Type 2 diabetes mellitus without complication, without long-term current use of insulin (HCC) Patient intolerant to metformin.  Severe GI side effects.  On Farxiga 10 mg a day and also taking Lipitor 20 mg a day.  Continue present medications.  Follow-up in 6 months.  Morbid obesity with BMI of 40.0-44.9, adult (HCC) Diabetes control.  Diet and nutrition.  Exercise.  Blood pressure control.   Onychomycosis Recurrent.  Unresponsive to over-the-counter medication.  Will try Lamisil daily for 2 weeks.  If successful may continue for 2-4 more weeks.  Poor sleep pattern Presently using Ambien 2-3 times a week.  Will refill medication.  Advised not to take it daily.  Felcia was seen today for medication refill and chronic conditions.  Diagnoses and all orders for this visit:   Uncontrolled hypertension -     losartan-hydrochlorothiazide (HYZAAR) 100-25 MG tablet; Take 1 tablet by mouth daily. -     Comprehensive metabolic panel  Insomnia, unspecified type -     zolpidem (AMBIEN) 10 MG tablet; Take 1 tablet (10 mg total) by mouth at bedtime as needed for sleep (Do not take every night, only as needed.).  Onychomycosis -     terbinafine (LAMISIL) 250 MG tablet; Take 1 tablet (250 mg total) by mouth daily for 14 days.  Type 2 diabetes mellitus with hyperglycemia, without long-term current use of insulin (HCC) -     Hemoglobin A1c  Dyslipidemia -     Lipid panel  Hypertension associated with diabetes (HCC)  Dyslipidemia associated with type 2 diabetes mellitus (HCC)  Morbid obesity with BMI of 40.0-44.9, adult (HCC)  Poor sleep pattern    Patient Instructions       If you have lab work done today you will be contacted with your lab results within the next 2 weeks.  If you have not heard from [place] then please contact us. The fastest way to get your results is to register for My Chart.   IF you received an x-ray today, you will receive an invoice from [facility]. Please contact [facility] at [phone] with questions or concerns regarding your invoice.   IF you received labwork today, you will receive an invoice from [place]. Please contact [facility] at [phone] with questions or concerns regarding your invoice.   Our billing staff will not be able to assist you with questions regarding bills from these companies.  You will be contacted with the lab results as soon as they are available. The fastest way to get your results is to activate your My Chart account. Instructions are located on the  last page of this paperwork. If you have not heard from [place] regarding the results in 2 weeks, please contact this office.      Diabetes Mellitus and Nutrition, Adult When you have diabetes (diabetes mellitus), it is very important to  have healthy eating habits because your blood sugar (glucose) levels are greatly affected by what you eat and drink. Eating healthy foods in the appropriate amounts, at about the same times every day, can help you:  Control your blood glucose.  Lower your risk of heart disease.  Improve your blood pressure.  Reach or maintain a healthy weight. Every person with diabetes is different, and each person has different needs for a meal plan. Your health care provider may recommend that you work with a diet and nutrition specialist (dietitian) to make a meal plan that is best for you. Your meal plan may vary depending on factors such as:  The calories you need.  The medicines you take.  Your weight.  Your blood glucose, blood pressure, and cholesterol levels.  Your activity level.  Other health conditions you have, such as heart or kidney disease. How do carbohydrates affect me? Carbohydrates, also called carbs, affect your blood glucose level more than any other type of food. Eating carbs naturally raises the amount of glucose in your blood. Carb counting is a method for keeping track of how many carbs you eat. Counting carbs is important to keep your blood glucose at a healthy level, especially if you use insulin or take certain oral diabetes medicines. It is important to know how many carbs you can safely have in each meal. This is different for every person. Your dietitian can help you calculate how many carbs you should have at each meal and for each snack. Foods that contain carbs include:  Bread, cereal, rice, pasta, and crackers.  Potatoes and corn.  Peas, beans, and lentils.  Milk and yogurt.  Fruit and juice.  Desserts, such as cakes, cookies, ice cream, and candy. How does alcohol affect me? Alcohol can cause a sudden decrease in blood glucose (hypoglycemia), especially if you use insulin or take certain oral diabetes medicines. Hypoglycemia can be a life-threatening  condition. Symptoms of hypoglycemia (sleepiness, dizziness, and confusion) are similar to symptoms of having too much alcohol. If your health care provider says that alcohol is safe for you, follow these guidelines:  Limit alcohol intake to no more than 1 drink per day for nonpregnant women and 2 drinks per day for men. One drink equals 12 oz of beer, 5 oz of wine, or 1 oz of hard liquor.  Do not drink on an empty stomach.  Keep yourself hydrated with water, diet soda, or unsweetened iced tea.  Keep in mind that regular soda, juice, and other mixers may contain a lot of sugar and must be counted as carbs. What are tips for following this plan?  Reading food labels  Start by checking the serving size on the "Nutrition Facts" label of packaged foods and drinks. The amount of calories, carbs, fats, and other nutrients listed on the label is based on one serving of the item. Many items contain more than one serving per package.  Check the total grams (g) of carbs in one serving. You can calculate the number of servings of carbs in one serving by dividing the total carbs by 15. For example, if a food has 30 g of total carbs, it would be equal to 2 servings of carbs.  Check the  number of grams (g) of saturated and trans fats in one serving. Choose foods that have low or no amount of these fats.  Check the number of milligrams (mg) of salt (sodium) in one serving. Most people should limit total sodium intake to less than 2,300 mg per day.  Always check the nutrition information of foods labeled as "low-fat" or "nonfat". These foods may be higher in added sugar or refined carbs and should be avoided.  Talk to your dietitian to identify your daily goals for nutrients listed on the label. Shopping  Avoid buying canned, premade, or processed foods. These foods tend to be high in fat, sodium, and added sugar.  Shop around the outside edge of the grocery store. This includes fresh fruits and  vegetables, bulk grains, fresh meats, and fresh dairy. Cooking  Use low-heat cooking methods, such as baking, instead of high-heat cooking methods like deep frying.  Cook using healthy oils, such as olive, canola, or sunflower oil.  Avoid cooking with butter, cream, or high-fat meats. Meal planning  Eat meals and snacks regularly, preferably at the same times every day. Avoid going long periods of time without eating.  Eat foods high in fiber, such as fresh fruits, vegetables, beans, and whole grains. Talk to your dietitian about how many servings of carbs you can eat at each meal.  Eat 4-6 ounces (oz) of lean protein each day, such as lean meat, chicken, fish, eggs, or tofu. One oz of lean protein is equal to: ? 1 oz of meat, chicken, or fish. ? 1 egg. ?  cup of tofu.  Eat some foods each day that contain healthy fats, such as avocado, nuts, seeds, and fish. Lifestyle  Check your blood glucose regularly.  Exercise regularly as told by your health care provider. This may include: ? 150 minutes of moderate-intensity or vigorous-intensity exercise each week. This could be brisk walking, biking, or water aerobics. ? Stretching and doing strength exercises, such as yoga or weightlifting, at least 2 times a week.  Take medicines as told by your health care provider.  Do not use any products that contain nicotine or tobacco, such as cigarettes and e-cigarettes. If you need help quitting, ask your health care provider.  Work with a Veterinary surgeon or diabetes educator to identify strategies to manage stress and any emotional and social challenges. Questions to ask a health care provider  Do I need to meet with a diabetes educator?  Do I need to meet with a dietitian?  What number can I call if I have questions?  When are the best times to check my blood glucose? Where to find more information:  American Diabetes Association: diabetes.org  Academy of Nutrition and Dietetics:  www.eatright.[employer] of Diabetes and Digestive and Kidney Diseases (NIH): CarFlippers.tn Summary  A healthy meal plan will help you control your blood glucose and maintain a healthy lifestyle.  Working with a diet and nutrition specialist (dietitian) can help you make a meal plan that is best for you.  Keep in mind that carbohydrates (carbs) and alcohol have immediate effects on your blood glucose levels. It is important to count carbs and to use alcohol carefully. This information is not intended to replace advice given to you by your health care provider. Make sure you discuss any questions you have with your health care provider. Document Released: 02/05/2005 Document Revised: 12/09/2016 Document Reviewed: 06/15/2016 Elsevier Interactive Patient Education  2019 Elsevier Inc.  Hypertension Hypertension, commonly called high blood  pressure, is when the force of blood pumping through the arteries is too strong. The arteries are the blood vessels that carry blood from the heart throughout the body. Hypertension forces the heart to work harder to pump blood and may cause arteries to become narrow or stiff. Having untreated or uncontrolled hypertension can cause heart attacks, strokes, kidney disease, and other problems. A blood pressure reading consists of a higher number over a lower number. Ideally, your blood pressure should be below 120/80. The first ("top") number is called the systolic pressure. It is a measure of the pressure in your arteries as your heart beats. The second ("bottom") number is called the diastolic pressure. It is a measure of the pressure in your arteries as the heart relaxes. What are the causes? The cause of this condition is not known. What increases the risk? Some risk factors for high blood pressure are under your control. Others are not. Factors you can change  Smoking.  Having type 2 diabetes mellitus, high cholesterol, or both.  Not  getting enough exercise or physical activity.  Being overweight.  Having too much fat, sugar, calories, or salt (sodium) in your diet.  Drinking too much alcohol. Factors that are difficult or impossible to change  Having chronic kidney disease.  Having a family history of high blood pressure.  Age. Risk increases with age.  Race. You may be at higher risk if you are African-American.  Gender. Men are at higher risk than women before age 28. After age 21, women are at higher risk than men.  Having obstructive sleep apnea.  Stress. What are the signs or symptoms? Extremely high blood pressure (hypertensive crisis) may cause:  Headache.  Anxiety.  Shortness of breath.  Nosebleed.  Nausea and vomiting.  Severe chest pain.  Jerky movements you cannot control (seizures). How is this diagnosed? This condition is diagnosed by measuring your blood pressure while you are seated, with your arm resting on a surface. The cuff of the blood pressure monitor will be placed directly against the skin of your upper arm at the level of your heart. It should be measured at least twice using the same arm. Certain conditions can cause a difference in blood pressure between your right and left arms. Certain factors can cause blood pressure readings to be lower or higher than normal (elevated) for a short period of time:  When your blood pressure is higher when you are in a health care provider's office than when you are at home, this is called white coat hypertension. Most people with this condition do not need medicines.  When your blood pressure is higher at home than when you are in a health care provider's office, this is called masked hypertension. Most people with this condition may need medicines to control blood pressure. If you have a high blood pressure reading during one visit or you have normal blood pressure with other risk factors:  You may be asked to return on a different day  to have your blood pressure checked again.  You may be asked to monitor your blood pressure at home for 1 week or longer. If you are diagnosed with hypertension, you may have other blood or imaging tests to help your health care provider understand your overall risk for other conditions. How is this treated? This condition is treated by making healthy lifestyle changes, such as eating healthy foods, exercising more, and reducing your alcohol intake. Your health care provider may prescribe medicine if lifestyle  changes are not enough to get your blood pressure under control, and if:  Your systolic blood pressure is above 130.  Your diastolic blood pressure is above 80. Your personal target blood pressure may vary depending on your medical conditions, your age, and other factors. Follow these instructions at home: Eating and drinking   Eat a diet that is high in fiber and potassium, and low in sodium, added sugar, and fat. An example eating plan is called the DASH (Dietary Approaches to Stop Hypertension) diet. To eat this way: ? Eat plenty of fresh fruits and vegetables. Try to fill half of your plate at each meal with fruits and vegetables. ? Eat whole grains, such as whole wheat pasta, brown rice, or whole grain bread. Fill about one quarter of your plate with whole grains. ? Eat or drink low-fat dairy products, such as skim milk or low-fat yogurt. ? Avoid fatty cuts of meat, processed or cured meats, and poultry with skin. Fill about one quarter of your plate with lean proteins, such as fish, chicken without skin, beans, eggs, and tofu. ? Avoid premade and processed foods. These tend to be higher in sodium, added sugar, and fat.  Reduce your daily sodium intake. Most people with hypertension should eat less than 1,500 mg of sodium a day.  Limit alcohol intake to no more than 1 drink a day for nonpregnant women and 2 drinks a day for men. One drink equals 12 oz of beer, 5 oz of wine, or 1  oz of hard liquor. Lifestyle   Work with your health care provider to maintain a healthy body weight or to lose weight. Ask what an ideal weight is for you.  Get at least 30 minutes of exercise that causes your heart to beat faster (aerobic exercise) most days of the week. Activities may include walking, swimming, or biking.  Include exercise to strengthen your muscles (resistance exercise), such as pilates or lifting weights, as part of your weekly exercise routine. Try to do these types of exercises for 30 minutes at least 3 days a week.  Do not use any products that contain nicotine or tobacco, such as cigarettes and e-cigarettes. If you need help quitting, ask your health care provider.  Monitor your blood pressure at home as told by your health care provider.  Keep all follow-up visits as told by your health care provider. This is important. Medicines  Take over-the-counter and prescription medicines only as told by your health care provider. Follow directions carefully. Blood pressure medicines must be taken as prescribed.  Do not skip doses of blood pressure medicine. Doing this puts you at risk for problems and can make the medicine less effective.  Ask your health care provider about side effects or reactions to medicines that you should watch for. Contact a health care provider if:  You think you are having a reaction to a medicine you are taking.  You have headaches that keep coming back (recurring).  You feel dizzy.  You have swelling in your ankles.  You have trouble with your vision. Get help right away if:  You develop a severe headache or confusion.  You have unusual weakness or numbness.  You feel faint.  You have severe pain in your chest or abdomen.  You vomit repeatedly.  You have trouble breathing. Summary  Hypertension is when the force of blood pumping through your arteries is too strong. If this condition is not controlled, it may put you at  risk  for serious complications.  Your personal target blood pressure may vary depending on your medical conditions, your age, and other factors. For most people, a normal blood pressure is less than 120/80.  Hypertension is treated with lifestyle changes, medicines, or a combination of both. Lifestyle changes include weight loss, eating a healthy, low-sodium diet, exercising more, and limiting alcohol. This information is not intended to replace advice given to you by your health care provider. Make sure you discuss any questions you have with your health care provider. Document Released: 05/11/2005 Document Revised: 04/08/2016 Document Reviewed: 04/08/2016 Elsevier Interactive Patient Education  2019 Elsevier Inc.      Edwina BarthMiguel Felipe Cabell, MD [facility]

## 2018-08-12 ENCOUNTER — Encounter: Payer: Self-pay | Admitting: Emergency Medicine

## 2018-08-12 ENCOUNTER — Telehealth: Payer: Self-pay | Admitting: *Deleted

## 2018-08-12 ENCOUNTER — Other Ambulatory Visit: Payer: Self-pay | Admitting: Emergency Medicine

## 2018-08-12 DIAGNOSIS — E1165 Type 2 diabetes mellitus with hyperglycemia: Secondary | ICD-10-CM

## 2018-08-12 LAB — COMPREHENSIVE METABOLIC PANEL
ALK PHOS: 119 IU/L — AB (ref 39–117)
ALT: 42 IU/L — ABNORMAL HIGH (ref 0–32)
AST: 30 IU/L (ref 0–40)
Albumin/Globulin Ratio: 2 (ref 1.2–2.2)
Albumin: 4.7 g/dL (ref 3.8–4.8)
BILIRUBIN TOTAL: 0.3 mg/dL (ref 0.0–1.2)
BUN/Creatinine Ratio: 20 (ref 9–23)
BUN: 17 mg/dL (ref 6–24)
CO2: 20 mmol/L (ref 20–29)
Calcium: 9.4 mg/dL (ref 8.7–10.2)
Chloride: 102 mmol/L (ref 96–106)
Creatinine, Ser: 0.83 mg/dL (ref 0.57–1.00)
GFR calc Af Amer: 96 mL/min/{1.73_m2} (ref >59)
GFR calc non Af Amer: 84 mL/min/{1.73_m2} (ref >59)
Globulin, Total: 2.3 g/dL (ref 1.5–4.5)
Glucose: 181 mg/dL — ABNORMAL HIGH (ref 65–99)
Potassium: 3.6 mmol/L (ref 3.5–5.2)
SODIUM: 138 mmol/L (ref 134–144)
Total Protein: 7 g/dL (ref 6.0–8.5)

## 2018-08-12 LAB — LIPID PANEL
Chol/HDL Ratio: 3.4 ratio (ref 0.0–4.4)
Cholesterol, Total: 118 mg/dL (ref 100–199)
HDL: 35 mg/dL — ABNORMAL LOW (ref >39)
LDL Calculated: 61 mg/dL (ref 0–99)
Triglycerides: 112 mg/dL (ref 0–149)
VLDL Cholesterol Cal: 22 mg/dL (ref 5–40)

## 2018-08-12 LAB — HEMOGLOBIN A1C
Est. average glucose Bld gHb Est-mCnc: 223 mg/dL
Hgb A1c MFr Bld: 9.4 % — ABNORMAL HIGH (ref 4.8–5.6)

## 2018-08-12 MED ORDER — METFORMIN HCL 500 MG PO TABS: 500.0000 mg | ORAL_TABLET | Freq: Two times a day (BID) | ORAL | 3 refills | Status: DC

## 2018-08-12 MED ORDER — GLIPIZIDE 5 MG PO TABS: 5.0000 mg | ORAL_TABLET | Freq: Two times a day (BID) | ORAL | 1 refills | Status: DC

## 2018-08-12 NOTE — Addendum Note (Signed)
Addended by: Evie Lacks on: 08/12/2018 08:51 AM   Modules accepted: Orders

## 2018-08-12 NOTE — Telephone Encounter (Signed)
Spoke to pharmacist at [facility] to cancel Metformin, because the medication was changed to Glipizide, per Dr Alvy Bimler. Patient has is unable to tolerate the Metformin.

## 2018-09-06 ENCOUNTER — Encounter: Payer: Self-pay | Admitting: Emergency Medicine

## 2018-09-06 ENCOUNTER — Other Ambulatory Visit: Payer: Self-pay | Admitting: Emergency Medicine

## 2018-09-06 MED ORDER — GLIPIZIDE ER 5 MG PO TB24: 5.0000 mg | ORAL_TABLET | Freq: Every day | ORAL | 5 refills | Status: DC

## 2018-09-06 NOTE — Telephone Encounter (Signed)
Lets try Glucotrol XL 5 mg, this is extended release and may minimize side effects. Prescription sent to pharmacy of record. Thanks.

## 2018-09-20 ENCOUNTER — Other Ambulatory Visit: Payer: Self-pay | Admitting: Emergency Medicine

## 2018-09-20 ENCOUNTER — Other Ambulatory Visit: Payer: Self-pay

## 2018-09-20 ENCOUNTER — Encounter: Payer: Self-pay | Admitting: Emergency Medicine

## 2018-09-20 DIAGNOSIS — G4700 Insomnia, unspecified: Secondary | ICD-10-CM

## 2018-09-20 DIAGNOSIS — B351 Tinea unguium: Secondary | ICD-10-CM

## 2018-09-20 MED ORDER — ZOLPIDEM TARTRATE 10 MG PO TABS: 10.0000 mg | ORAL_TABLET | Freq: Every evening | ORAL | 0 refills | Status: DC | PRN

## 2018-09-20 MED ORDER — TERBINAFINE HCL 250 MG PO TABS: 250.0000 mg | ORAL_TABLET | Freq: Every day | ORAL | 1 refills | Status: AC

## 2018-09-20 NOTE — Telephone Encounter (Signed)
Where can I find your pended orders? Thanks.

## 2018-11-05 ENCOUNTER — Other Ambulatory Visit: Payer: Self-pay | Admitting: Emergency Medicine

## 2018-11-05 DIAGNOSIS — E119 Type 2 diabetes mellitus without complications: Secondary | ICD-10-CM

## 2018-11-07 NOTE — Telephone Encounter (Signed)
Will Dr. Mitchel Honour be taking over rx for atorvastatin

## 2018-11-09 ENCOUNTER — Other Ambulatory Visit: Payer: Self-pay | Admitting: Emergency Medicine

## 2018-11-09 DIAGNOSIS — G4700 Insomnia, unspecified: Secondary | ICD-10-CM

## 2018-11-09 NOTE — Telephone Encounter (Signed)
Please advise 

## 2018-11-11 ENCOUNTER — Other Ambulatory Visit: Payer: Self-pay | Admitting: Emergency Medicine

## 2018-11-11 ENCOUNTER — Encounter: Payer: Self-pay | Admitting: Emergency Medicine

## 2018-11-11 NOTE — Telephone Encounter (Signed)
Pt may need office visit if the infection has not cleared up

## 2018-11-11 NOTE — Telephone Encounter (Signed)
Please advise on controlled medication  Last seen 08/11/18 for Uncontrolled HTN Last refilled 09/20/18 #30 0 refills Next OV 02/08/19

## 2018-11-11 NOTE — Telephone Encounter (Signed)
Not on current med list please advise

## 2018-11-13 NOTE — Telephone Encounter (Signed)
Agree 

## 2019-02-04 ENCOUNTER — Other Ambulatory Visit: Payer: Self-pay | Admitting: Emergency Medicine

## 2019-02-04 DIAGNOSIS — G4700 Insomnia, unspecified: Secondary | ICD-10-CM

## 2019-02-06 MED ORDER — ZOLPIDEM TARTRATE 10 MG PO TABS: 10.0000 mg | ORAL_TABLET | Freq: Every evening | ORAL | 0 refills | Status: DC | PRN

## 2019-02-08 ENCOUNTER — Ambulatory Visit: Payer: 59 | Admitting: Emergency Medicine

## 2019-02-16 ENCOUNTER — Other Ambulatory Visit: Payer: Self-pay

## 2019-02-16 ENCOUNTER — Encounter: Payer: Self-pay | Admitting: Emergency Medicine

## 2019-02-16 ENCOUNTER — Ambulatory Visit: Payer: 59 | Admitting: Emergency Medicine

## 2019-02-16 VITALS — BP 146/86 | HR 95 | Temp 99.3°F | Ht 68.0 in | Wt 268.4 lb

## 2019-02-16 DIAGNOSIS — E1159 Type 2 diabetes mellitus with other circulatory complications: Secondary | ICD-10-CM | POA: Diagnosis not present

## 2019-02-16 DIAGNOSIS — I10 Essential (primary) hypertension: Secondary | ICD-10-CM

## 2019-02-16 DIAGNOSIS — E1169 Type 2 diabetes mellitus with other specified complication: Secondary | ICD-10-CM | POA: Diagnosis not present

## 2019-02-16 DIAGNOSIS — F17200 Nicotine dependence, unspecified, uncomplicated: Secondary | ICD-10-CM

## 2019-02-16 DIAGNOSIS — E1165 Type 2 diabetes mellitus with hyperglycemia: Secondary | ICD-10-CM | POA: Diagnosis not present

## 2019-02-16 DIAGNOSIS — G4700 Insomnia, unspecified: Secondary | ICD-10-CM

## 2019-02-16 DIAGNOSIS — E785 Hyperlipidemia, unspecified: Secondary | ICD-10-CM

## 2019-02-16 LAB — GLUCOSE, POCT (MANUAL RESULT ENTRY): POC Glucose: 158 mg/dl — AB (ref 70–99)

## 2019-02-16 MED ORDER — AMLODIPINE BESYLATE 5 MG PO TABS: 5.0000 mg | ORAL_TABLET | Freq: Every day | ORAL | 3 refills | Status: DC

## 2019-02-16 MED ORDER — ATORVASTATIN CALCIUM 20 MG PO TABS: 20.0000 mg | ORAL_TABLET | Freq: Every day | ORAL | 3 refills | Status: DC

## 2019-02-16 MED ORDER — FARXIGA 10 MG PO TABS: 10.0000 mg | ORAL_TABLET | Freq: Every day | ORAL | 1 refills | Status: DC

## 2019-02-16 MED ORDER — TRULICITY 0.75 MG/0.5ML ~~LOC~~ SOAJ: 0.7500 mg | SUBCUTANEOUS | 5 refills | Status: DC

## 2019-02-16 MED ORDER — LOSARTAN POTASSIUM-HCTZ 100-25 MG PO TABS: 1.0000 | ORAL_TABLET | Freq: Every day | ORAL | 3 refills | Status: DC

## 2019-02-16 NOTE — Progress Notes (Signed)
Wt Readings from Last 3 Encounters:  08/11/18 265 lb 12.8 oz (120.6 kg)  05/12/18 264 lb 6.4 oz (119.9 kg)  09/30/17 243 lb (110.2 kg)   Lab Results  Component Value Date   HGBA1C 9.4 (H) 08/11/2018   BP Readings from Last 3 Encounters:  08/11/18 (!) 170/80  05/12/18 (!) 163/88  09/30/17 (!) 153/97   Lab Results  Component Value Date   CHOL 118 08/11/2018   HDL 35 (L) 08/11/2018   LDLCALC 61 08/11/2018   TRIG 112 08/11/2018   CHOLHDL 3.4 08/11/2018   Lab Results  Component Value Date   CREATININE 0.83 08/11/2018   BUN 17 08/11/2018   NA 138 08/11/2018   K 3.6 08/11/2018   CL 102 08/11/2018   CO2 20 08/11/2018   [name] 48 y.o.   Chief Complaint  Patient presents with  . Diabetes    6 month follow up  . Hypertension    HISTORY OF PRESENT ILLNESS: This is a 48 y.o. female here for follow-up of chronic medical problems. 1.  Diabetes type 2: Intolerant to metformin and glipizide, severe GI side effects.  Presently taking Farxiga 10 mg daily.  Does not check sugars at home.  Asymptomatic. 2.  Hypertension: On Hyzaar 100-25 mg daily.  Blood pressures at home 130s over 80s. 3.  Smoker.  No intentions or motivation to quit. 4.  Chronic insomnia: Takes Ambien 2-3 times a week.  Recent sleep apnea test was negative. 5.  Dyslipidemia: On Lipitor 20 mg daily. 6.  Onychomycosis: Much improved. Has no complaints or medical concerns today.  HPI   Prior to Admission medications   Medication Sig Start Date End Date Taking? Authorizing Provider  atorvastatin (LIPITOR) 20 MG tablet TAKE 1 TABLET BY MOUTH EVERY DAY 11/07/18  Yes Pura Picinich, Eilleen Kempf, MD  etonogestrel (IMPLANON) 68 MG IMPL implant Inject 1 each into the skin once.   Yes [provider]  FARXIGA 10 MG TABS tablet TAKE 1 TABLET BY MOUTH EVERY DAY 11/07/18  Yes Griselda Tosh, Eilleen Kempf, MD  zolpidem (AMBIEN) 10 MG tablet Take 1 tablet (10 mg total) by mouth at bedtime as needed for sleep. 02/06/19   Yes Chason Mciver, Eilleen Kempf, MD  glipiZIDE (GLUCOTROL XL) 5 MG 24 hr tablet Take 1 tablet (5 mg total) by mouth daily with breakfast. Patient not taking: Reported on 02/16/2019 09/06/18   Georgina Quint, MD  losartan-hydrochlorothiazide (HYZAAR) 100-25 MG tablet Take 1 tablet by mouth daily. 08/11/18 11/09/18  Georgina Quint, MD  metFORMIN (GLUCOPHAGE) 500 MG tablet Take 1 tablet (500 mg total) by mouth 2 (two) times daily with a meal. Patient not taking: Reported on 02/16/2019 08/12/18   Georgina Quint, MD    No Known Allergies  Patient Active Problem List   Diagnosis Date Noted  . Dyslipidemia 08/11/2018  . Onychomycosis 08/11/2018  . Morbid obesity with BMI of 40.0-44.9, adult (HCC) 08/11/2018  . Dyslipidemia associated with type 2 diabetes mellitus (HCC) 09/30/2017  . Poor sleep pattern 09/30/2017  . Type 2 diabetes mellitus without complication, without long-term current use of insulin (HCC) 06/25/2017    Past Medical History:  Diagnosis Date  . Allergy    seasonal  . Diabetes mellitus without complication (HCC)   . Hyperlipidemia   . Hypertension     Past Surgical History:  Procedure Laterality Date  . TONSILLECTOMY    . WISDOM TOOTH EXTRACTION      Social History   Socioeconomic History  .  Marital status: Married    Spouse name: Not on file  . Number of children: Not on file  . Years of education: Not on file  . Highest education level: Not on file  Occupational History  . Not on file  Social Needs  . Financial resource strain: Not on file  . Food insecurity    Worry: Not on file    Inability: Not on file  . Transportation needs    Medical: Not on file    Non-medical: Not on file  Tobacco Use  . Smoking status: Current Every Day Smoker    Packs/day: 0.50    Last attempt to quit: 08/23/2003    Years since quitting: 15.4  . Smokeless tobacco: Never Used  Substance and Sexual Activity  . Alcohol use: Yes    Alcohol/week: 0.0 standard drinks     Comment: occasionally  . Drug use: No  . Sexual activity: Yes    Birth control/protection: Implant  Lifestyle  . Physical activity    Days per week: Not on file    Minutes per session: Not on file  . Stress: Not on file  Relationships  . Social Musician on phone: Not on file    Gets together: Not on file    Attends religious service: Not on file    Active member of club or organization: Not on file    Attends meetings of clubs or organizations: Not on file    Relationship status: Not on file  . Intimate partner violence    Fear of current or ex partner: Not on file    Emotionally abused: Not on file    Physically abused: Not on file    Forced sexual activity: Not on file  Other Topics Concern  . Not on file  Social History Narrative  . Not on file    Family History  Problem Relation Age of Onset  . Diabetes Maternal Grandmother   . Colon cancer Mother      Review of Systems  Constitutional: Negative.  Negative for chills and fever.  HENT: Negative.  Negative for congestion and sore throat.   Eyes: Negative.   Respiratory: Negative.  Negative for cough and shortness of breath.   Cardiovascular: Negative.  Negative for chest pain, palpitations and leg swelling.  Gastrointestinal: Negative for abdominal pain, blood in stool, diarrhea, melena, nausea and vomiting.  Genitourinary: Negative.  Negative for hematuria.  Musculoskeletal: Negative for myalgias and neck pain.  Skin: Negative.  Negative for rash.  Neurological: Negative.  Negative for dizziness and headaches.  Endo/Heme/Allergies: Negative.   All other systems reviewed and are negative.   Vitals:   02/16/19 1003  BP: (!) 168/91  Pulse: 95  Temp: 99.3 F (37.4 C)    Physical Exam Vitals signs reviewed.  Constitutional:      Appearance: Normal appearance.  HENT:     Head: Normocephalic.  Eyes:     Extraocular Movements: Extraocular movements intact.     Pupils: Pupils are equal,  round, and reactive to light.  Neck:     Musculoskeletal: Normal range of motion and neck supple.  Cardiovascular:     Rate and Rhythm: Normal rate and regular rhythm.     Pulses: Normal pulses.     Heart sounds: Normal heart sounds.  Pulmonary:     Effort: Pulmonary effort is normal.     Breath sounds: Normal breath sounds.  Musculoskeletal: Normal range of motion.  Skin:  General: Skin is warm and dry.     Capillary Refill: Capillary refill takes less than 2 seconds.  Neurological:     General: No focal deficit present.     Mental Status: She is alert and oriented to person, place, and time.  Psychiatric:        Mood and Affect: Mood normal.        Behavior: Behavior normal.      Results for orders placed or performed in visit on 02/16/19 (from the past 24 hour(s))  POCT glucose (manual entry)     Status: Abnormal   Collection Time: 02/16/19 10:40 AM  Result Value Ref Range   POC Glucose 158 (A) 70 - 99 mg/dl     ASSESSMENT & PLAN: Hypertension associated with diabetes ([place]) Patient discontinued glipizide due to severe GI side effects.  She is also intolerant to metformin.  Tolerating Farxiga 10 mg daily very well.  Will add Trulicity 1.61 mg weekly.  Diet and nutrition discussed with patient. Hypertension still uncontrolled.  Patient compliant with Hyzaar 100-25 mg daily.  Will add amlodipine 5 mg daily.  Still smoking with no intentions of stopping despite my strong advice to quit. Continue statin therapy with Lipitor 20 mg daily. Blood work done today while fasting.  Prarthana was seen today for diabetes and hypertension.  Diagnoses and all orders for this visit:  Hypertension associated with diabetes ([place]) -     dapagliflozin propanediol (FARXIGA) 10 MG TABS tablet; Take 10 mg by mouth daily. -     losartan-hydrochlorothiazide (HYZAAR) 100-25 MG tablet; Take 1 tablet by mouth daily. -     CBC with Differential/Platelet -     Comprehensive metabolic panel -      Hemoglobin A1c -     Lipid panel  Dyslipidemia associated with type 2 diabetes mellitus (HCC) -     atorvastatin (LIPITOR) 20 MG tablet; Take 1 tablet (20 mg total) by mouth daily. -     CBC with Differential/Platelet -     Comprehensive metabolic panel -     Hemoglobin A1c -     Lipid panel  Type 2 diabetes mellitus with hyperglycemia, without long-term current use of insulin (HCC) -     POCT glucose (manual entry) -     Microalbumin, urine -     Dulaglutide (TRULICITY) 0.96 EA/5.4UJ SOPN; Inject 0.75 mg into the skin once a week.  Uncontrolled hypertension -     amLODipine (NORVASC) 5 MG tablet; Take 1 tablet (5 mg total) by mouth daily.    Patient Instructions       If you have lab work done today you will be contacted with your lab results within the next 2 weeks.  If you have not heard from [place] then please contact us. The fastest way to get your results is to register for My Chart.   IF you received an x-ray today, you will receive an invoice from [facility]. Please contact [facility] at [phone] with questions or concerns regarding your invoice.   IF you received labwork today, you will receive an invoice from [facility]. Please contact [facility] at [phone] with questions or concerns regarding your invoice.   Our billing staff will not be able to assist you with questions regarding bills from these companies.  You will be contacted with the lab results as soon as they are available. The fastest way to get your results is to activate your My Chart account. Instructions are located on the  last page of this paperwork. If you have not heard from [place] regarding the results in 2 weeks, please contact this office.     Diabetes Mellitus and Nutrition, Adult When you have diabetes (diabetes mellitus), it is very important to have healthy eating habits because your blood sugar (glucose) levels are greatly affected by what you eat and drink. Eating healthy  foods in the appropriate amounts, at about the same times every day, can help you:  Control your blood glucose.  Lower your risk of heart disease.  Improve your blood pressure.  Reach or maintain a healthy weight. Every person with diabetes is different, and each person has different needs for a meal plan. Your health care provider may recommend that you work with a diet and nutrition specialist (dietitian) to make a meal plan that is best for you. Your meal plan may vary depending on factors such as:  The calories you need.  The medicines you take.  Your weight.  Your blood glucose, blood pressure, and cholesterol levels.  Your activity level.  Other health conditions you have, such as heart or kidney disease. How do carbohydrates affect me? Carbohydrates, also called carbs, affect your blood glucose level more than any other type of food. Eating carbs naturally raises the amount of glucose in your blood. Carb counting is a method for keeping track of how many carbs you eat. Counting carbs is important to keep your blood glucose at a healthy level, especially if you use insulin or take certain oral diabetes medicines. It is important to know how many carbs you can safely have in each meal. This is different for every person. Your dietitian can help you calculate how many carbs you should have at each meal and for each snack. Foods that contain carbs include:  Bread, cereal, rice, pasta, and crackers.  Potatoes and corn.  Peas, beans, and lentils.  Milk and yogurt.  Fruit and juice.  Desserts, such as cakes, cookies, ice cream, and candy. How does alcohol affect me? Alcohol can cause a sudden decrease in blood glucose (hypoglycemia), especially if you use insulin or take certain oral diabetes medicines. Hypoglycemia can be a life-threatening condition. Symptoms of hypoglycemia (sleepiness, dizziness, and confusion) are similar to symptoms of having too much alcohol. If your  health care provider says that alcohol is safe for you, follow these guidelines:  Limit alcohol intake to no more than 1 drink per day for nonpregnant women and 2 drinks per day for men. One drink equals 12 oz of beer, 5 oz of wine, or 1 oz of hard liquor.  Do not drink on an empty stomach.  Keep yourself hydrated with water, diet soda, or unsweetened iced tea.  Keep in mind that regular soda, juice, and other mixers may contain a lot of sugar and must be counted as carbs. What are tips for following this plan?  Reading food labels  Start by checking the serving size on the "Nutrition Facts" label of packaged foods and drinks. The amount of calories, carbs, fats, and other nutrients listed on the label is based on one serving of the item. Many items contain more than one serving per package.  Check the total grams (g) of carbs in one serving. You can calculate the number of servings of carbs in one serving by dividing the total carbs by 15. For example, if a food has 30 g of total carbs, it would be equal to 2 servings of carbs.  Check the number  of grams (g) of saturated and trans fats in one serving. Choose foods that have low or no amount of these fats.  Check the number of milligrams (mg) of salt (sodium) in one serving. Most people should limit total sodium intake to less than 2,300 mg per day.  Always check the nutrition information of foods labeled as "low-fat" or "nonfat". These foods may be higher in added sugar or refined carbs and should be avoided.  Talk to your dietitian to identify your daily goals for nutrients listed on the label. Shopping  Avoid buying canned, premade, or processed foods. These foods tend to be high in fat, sodium, and added sugar.  Shop around the outside edge of the grocery store. This includes fresh fruits and vegetables, bulk grains, fresh meats, and fresh dairy. Cooking  Use low-heat cooking methods, such as baking, instead of high-heat cooking  methods like deep frying.  Cook using healthy oils, such as olive, canola, or sunflower oil.  Avoid cooking with butter, cream, or high-fat meats. Meal planning  Eat meals and snacks regularly, preferably at the same times every day. Avoid going long periods of time without eating.  Eat foods high in fiber, such as fresh fruits, vegetables, beans, and whole grains. Talk to your dietitian about how many servings of carbs you can eat at each meal.  Eat 4-6 ounces (oz) of lean protein each day, such as lean meat, chicken, fish, eggs, or tofu. One oz of lean protein is equal to: ? 1 oz of meat, chicken, or fish. ? 1 egg. ?  cup of tofu.  Eat some foods each day that contain healthy fats, such as avocado, nuts, seeds, and fish. Lifestyle  Check your blood glucose regularly.  Exercise regularly as told by your health care provider. This may include: ? 150 minutes of moderate-intensity or vigorous-intensity exercise each week. This could be brisk walking, biking, or water aerobics. ? Stretching and doing strength exercises, such as yoga or weightlifting, at least 2 times a week.  Take medicines as told by your health care provider.  Do not use any products that contain nicotine or tobacco, such as cigarettes and e-cigarettes. If you need help quitting, ask your health care provider.  Work with a Veterinary surgeon or diabetes educator to identify strategies to manage stress and any emotional and social challenges. Questions to ask a health care provider  Do I need to meet with a diabetes educator?  Do I need to meet with a dietitian?  What number can I call if I have questions?  When are the best times to check my blood glucose? Where to find more information:  American Diabetes Association: diabetes.org  Academy of Nutrition and Dietetics: www.eatright.[employer] of Diabetes and Digestive and Kidney Diseases (NIH): CarFlippers.tn Summary  A healthy meal plan will  help you control your blood glucose and maintain a healthy lifestyle.  Working with a diet and nutrition specialist (dietitian) can help you make a meal plan that is best for you.  Keep in mind that carbohydrates (carbs) and alcohol have immediate effects on your blood glucose levels. It is important to count carbs and to use alcohol carefully. This information is not intended to replace advice given to you by your health care provider. Make sure you discuss any questions you have with your health care provider. Document Released: 02/05/2005 Document Revised: 04/23/2017 Document Reviewed: 06/15/2016 Elsevier Patient Education  2020 Elsevier Inc.  Hypertension, Adult High blood pressure (hypertension) is when  the force of blood pumping through the arteries is too strong. The arteries are the blood vessels that carry blood from the heart throughout the body. Hypertension forces the heart to work harder to pump blood and may cause arteries to become narrow or stiff. Untreated or uncontrolled hypertension can cause a heart attack, heart failure, a stroke, kidney disease, and other problems. A blood pressure reading consists of a higher number over a lower number. Ideally, your blood pressure should be below 120/80. The first ("top") number is called the systolic pressure. It is a measure of the pressure in your arteries as your heart beats. The second ("bottom") number is called the diastolic pressure. It is a measure of the pressure in your arteries as the heart relaxes. What are the causes? The exact cause of this condition is not known. There are some conditions that result in or are related to high blood pressure. What increases the risk? Some risk factors for high blood pressure are under your control. The following factors may make you more likely to develop this condition:  Smoking.  Having type 2 diabetes mellitus, high cholesterol, or both.  Not getting enough exercise or physical  activity.  Being overweight.  Having too much fat, sugar, calories, or salt (sodium) in your diet.  Drinking too much alcohol. Some risk factors for high blood pressure may be difficult or impossible to change. Some of these factors include:  Having chronic kidney disease.  Having a family history of high blood pressure.  Age. Risk increases with age.  Race. You may be at higher risk if you are African American.  Gender. Men are at higher risk than women before age 61. After age 63, women are at higher risk than men.  Having obstructive sleep apnea.  Stress. What are the signs or symptoms? High blood pressure may not cause symptoms. Very high blood pressure (hypertensive crisis) may cause:  Headache.  Anxiety.  Shortness of breath.  Nosebleed.  Nausea and vomiting.  Vision changes.  Severe chest pain.  Seizures. How is this diagnosed? This condition is diagnosed by measuring your blood pressure while you are seated, with your arm resting on a flat surface, your legs uncrossed, and your feet flat on the floor. The cuff of the blood pressure monitor will be placed directly against the skin of your upper arm at the level of your heart. It should be measured at least twice using the same arm. Certain conditions can cause a difference in blood pressure between your right and left arms. Certain factors can cause blood pressure readings to be lower or higher than normal for a short period of time:  When your blood pressure is higher when you are in a health care provider's office than when you are at home, this is called white coat hypertension. Most people with this condition do not need medicines.  When your blood pressure is higher at home than when you are in a health care provider's office, this is called masked hypertension. Most people with this condition may need medicines to control blood pressure. If you have a high blood pressure reading during one visit or you have  normal blood pressure with other risk factors, you may be asked to:  Return on a different day to have your blood pressure checked again.  Monitor your blood pressure at home for 1 week or longer. If you are diagnosed with hypertension, you may have other blood or imaging tests to help your health care  provider understand your overall risk for other conditions. How is this treated? This condition is treated by making healthy lifestyle changes, such as eating healthy foods, exercising more, and reducing your alcohol intake. Your health care provider may prescribe medicine if lifestyle changes are not enough to get your blood pressure under control, and if:  Your systolic blood pressure is above 130.  Your diastolic blood pressure is above 80. Your personal target blood pressure may vary depending on your medical conditions, your age, and other factors. Follow these instructions at home: Eating and drinking   Eat a diet that is high in fiber and potassium, and low in sodium, added sugar, and fat. An example eating plan is called the DASH (Dietary Approaches to Stop Hypertension) diet. To eat this way: ? Eat plenty of fresh fruits and vegetables. Try to fill one half of your plate at each meal with fruits and vegetables. ? Eat whole grains, such as whole-wheat pasta, brown rice, or whole-grain bread. Fill about one fourth of your plate with whole grains. ? Eat or drink low-fat dairy products, such as skim milk or low-fat yogurt. ? Avoid fatty cuts of meat, processed or cured meats, and poultry with skin. Fill about one fourth of your plate with lean proteins, such as fish, chicken without skin, beans, eggs, or tofu. ? Avoid pre-made and processed foods. These tend to be higher in sodium, added sugar, and fat.  Reduce your daily sodium intake. Most people with hypertension should eat less than 1,500 mg of sodium a day.  Do not drink alcohol if: ? Your health care provider tells you not to  drink. ? You are pregnant, may be pregnant, or are planning to become pregnant.  If you drink alcohol: ? Limit how much you use to:  0-1 drink a day for women.  0-2 drinks a day for men. ? Be aware of how much alcohol is in your drink. In the [place] one drink equals one 12 oz bottle of beer (355 mL), one 5 oz glass of wine (148 mL), or one 1 oz glass of hard liquor (44 mL). Lifestyle   Work with your health care provider to maintain a healthy body weight or to lose weight. Ask what an ideal weight is for you.  Get at least 30 minutes of exercise most days of the week. Activities may include walking, swimming, or biking.  Include exercise to strengthen your muscles (resistance exercise), such as Pilates or lifting weights, as part of your weekly exercise routine. Try to do these types of exercises for 30 minutes at least 3 days a week.  Do not use any products that contain nicotine or tobacco, such as cigarettes, e-cigarettes, and chewing tobacco. If you need help quitting, ask your health care provider.  Monitor your blood pressure at home as told by your health care provider.  Keep all follow-up visits as told by your health care provider. This is important. Medicines  Take over-the-counter and prescription medicines only as told by your health care provider. Follow directions carefully. Blood pressure medicines must be taken as prescribed.  Do not skip doses of blood pressure medicine. Doing this puts you at risk for problems and can make the medicine less effective.  Ask your health care provider about side effects or reactions to medicines that you should watch for. Contact a health care provider if you:  Think you are having a reaction to a medicine you are taking.  Have headaches that keep coming  back (recurring).  Feel dizzy.  Have swelling in your ankles.  Have trouble with your vision. Get help right away if you:  Develop a severe headache or confusion.  Have  unusual weakness or numbness.  Feel faint.  Have severe pain in your chest or abdomen.  Vomit repeatedly.  Have trouble breathing. Summary  Hypertension is when the force of blood pumping through your arteries is too strong. If this condition is not controlled, it may put you at risk for serious complications.  Your personal target blood pressure may vary depending on your medical conditions, your age, and other factors. For most people, a normal blood pressure is less than 120/80.  Hypertension is treated with lifestyle changes, medicines, or a combination of both. Lifestyle changes include losing weight, eating a healthy, low-sodium diet, exercising more, and limiting alcohol. This information is not intended to replace advice given to you by your health care provider. Make sure you discuss any questions you have with your health care provider. Document Released: 05/11/2005 Document Revised: 01/19/2018 Document Reviewed: 01/19/2018 Elsevier Patient Education  2020 Elsevier Inc.      Edwina Barth, MD [facility] [facility]

## 2019-02-16 NOTE — Assessment & Plan Note (Signed)
Patient discontinued glipizide due to severe GI side effects.  She is also intolerant to metformin.  Tolerating Farxiga 10 mg daily very well.  Will add Trulicity 0.73 mg weekly.  Diet and nutrition discussed with patient. Hypertension still uncontrolled.  Patient compliant with Hyzaar 100-25 mg daily.  Will add amlodipine 5 mg daily.  Still smoking with no intentions of stopping despite my strong advice to quit. Continue statin therapy with Lipitor 20 mg daily. Blood work done today while fasting.

## 2019-02-16 NOTE — Patient Instructions (Addendum)
   If you have lab work done today you will be contacted with your lab results within the next 2 weeks.  If you have not heard from us then please contact us. The fastest way to get your results is to register for My Chart.   IF you received an x-ray today, you will receive an invoice from [facility]. Please contact [facility] at [phone] with questions or concerns regarding your invoice.   IF you received labwork today, you will receive an invoice from [facility]. Please contact [facility] at [phone] with questions or concerns regarding your invoice.   Our billing staff will not be able to assist you with questions regarding bills from these companies.  You will be contacted with the lab results as soon as they are available. The fastest way to get your results is to activate your My Chart account. Instructions are located on the last page of this paperwork. If you have not heard from us regarding the results in 2 weeks, please contact this office.     Diabetes Mellitus and Nutrition, Adult When you have diabetes (diabetes mellitus), it is very important to have healthy eating habits because your blood sugar (glucose) levels are greatly affected by what you eat and drink. Eating healthy foods in the appropriate amounts, at about the same times every day, can help you:  Control your blood glucose.  Lower your risk of heart disease.  Improve your blood pressure.  Reach or maintain a healthy weight. Every person with diabetes is different, and each person has different needs for a meal plan. Your health care provider may recommend that you work with a diet and nutrition specialist (dietitian) to make a meal plan that is best for you. Your meal plan may vary depending on factors such as:  The calories you need.  The medicines you take.  Your weight.  Your blood glucose, blood pressure, and cholesterol levels.  Your activity level.  Other health conditions  you have, such as heart or kidney disease. How do carbohydrates affect me? Carbohydrates, also called carbs, affect your blood glucose level more than any other type of food. Eating carbs naturally raises the amount of glucose in your blood. Carb counting is a method for keeping track of how many carbs you eat. Counting carbs is important to keep your blood glucose at a healthy level, especially if you use insulin or take certain oral diabetes medicines. It is important to know how many carbs you can safely have in each meal. This is different for every person. Your dietitian can help you calculate how many carbs you should have at each meal and for each snack. Foods that contain carbs include:  Bread, cereal, rice, pasta, and crackers.  Potatoes and corn.  Peas, beans, and lentils.  Milk and yogurt.  Fruit and juice.  Desserts, such as cakes, cookies, ice cream, and candy. How does alcohol affect me? Alcohol can cause a sudden decrease in blood glucose (hypoglycemia), especially if you use insulin or take certain oral diabetes medicines. Hypoglycemia can be a life-threatening condition. Symptoms of hypoglycemia (sleepiness, dizziness, and confusion) are similar to symptoms of having too much alcohol. If your health care provider says that alcohol is safe for you, follow these guidelines:  Limit alcohol intake to no more than 1 drink per day for nonpregnant women and 2 drinks per day for men. One drink equals 12 oz of beer, 5 oz of wine, or 1 oz of hard liquor.    Do not drink on an empty stomach.  Keep yourself hydrated with water, diet soda, or unsweetened iced tea.  Keep in mind that regular soda, juice, and other mixers may contain a lot of sugar and must be counted as carbs. What are tips for following this plan?  Reading food labels  Start by checking the serving size on the "Nutrition Facts" label of packaged foods and drinks. The amount of calories, carbs, fats, and other  nutrients listed on the label is based on one serving of the item. Many items contain more than one serving per package.  Check the total grams (g) of carbs in one serving. You can calculate the number of servings of carbs in one serving by dividing the total carbs by 15. For example, if a food has 30 g of total carbs, it would be equal to 2 servings of carbs.  Check the number of grams (g) of saturated and trans fats in one serving. Choose foods that have low or no amount of these fats.  Check the number of milligrams (mg) of salt (sodium) in one serving. Most people should limit total sodium intake to less than 2,300 mg per day.  Always check the nutrition information of foods labeled as "low-fat" or "nonfat". These foods may be higher in added sugar or refined carbs and should be avoided.  Talk to your dietitian to identify your daily goals for nutrients listed on the label. Shopping  Avoid buying canned, premade, or processed foods. These foods tend to be high in fat, sodium, and added sugar.  Shop around the outside edge of the grocery store. This includes fresh fruits and vegetables, bulk grains, fresh meats, and fresh dairy. Cooking  Use low-heat cooking methods, such as baking, instead of high-heat cooking methods like deep frying.  Cook using healthy oils, such as olive, canola, or sunflower oil.  Avoid cooking with butter, cream, or high-fat meats. Meal planning  Eat meals and snacks regularly, preferably at the same times every day. Avoid going long periods of time without eating.  Eat foods high in fiber, such as fresh fruits, vegetables, beans, and whole grains. Talk to your dietitian about how many servings of carbs you can eat at each meal.  Eat 4-6 ounces (oz) of lean protein each day, such as lean meat, chicken, fish, eggs, or tofu. One oz of lean protein is equal to: ? 1 oz of meat, chicken, or fish. ? 1 egg. ?  cup of tofu.  Eat some foods each day that contain  healthy fats, such as avocado, nuts, seeds, and fish. Lifestyle  Check your blood glucose regularly.  Exercise regularly as told by your health care provider. This may include: ? 150 minutes of moderate-intensity or vigorous-intensity exercise each week. This could be brisk walking, biking, or water aerobics. ? Stretching and doing strength exercises, such as yoga or weightlifting, at least 2 times a week.  Take medicines as told by your health care provider.  Do not use any products that contain nicotine or tobacco, such as cigarettes and e-cigarettes. If you need help quitting, ask your health care provider.  Work with a counselor or diabetes educator to identify strategies to manage stress and any emotional and social challenges. Questions to ask a health care provider  Do I need to meet with a diabetes educator?  Do I need to meet with a dietitian?  What number can I call if I have questions?  When are the best times to   check my blood glucose? Where to find more information:  American Diabetes Association: diabetes.org  Academy of Nutrition and Dietetics: www.eatright.org  National Institute of Diabetes and Digestive and Kidney Diseases (NIH): www.niddk.nih.gov Summary  A healthy meal plan will help you control your blood glucose and maintain a healthy lifestyle.  Working with a diet and nutrition specialist (dietitian) can help you make a meal plan that is best for you.  Keep in mind that carbohydrates (carbs) and alcohol have immediate effects on your blood glucose levels. It is important to count carbs and to use alcohol carefully. This information is not intended to replace advice given to you by your health care provider. Make sure you discuss any questions you have with your health care provider. Document Released: 02/05/2005 Document Revised: 04/23/2017 Document Reviewed: 06/15/2016 Elsevier Patient Education  2020 Elsevier Inc.  Hypertension, Adult High blood  pressure (hypertension) is when the force of blood pumping through the arteries is too strong. The arteries are the blood vessels that carry blood from the heart throughout the body. Hypertension forces the heart to work harder to pump blood and may cause arteries to become narrow or stiff. Untreated or uncontrolled hypertension can cause a heart attack, heart failure, a stroke, kidney disease, and other problems. A blood pressure reading consists of a higher number over a lower number. Ideally, your blood pressure should be below 120/80. The first ("top") number is called the systolic pressure. It is a measure of the pressure in your arteries as your heart beats. The second ("bottom") number is called the diastolic pressure. It is a measure of the pressure in your arteries as the heart relaxes. What are the causes? The exact cause of this condition is not known. There are some conditions that result in or are related to high blood pressure. What increases the risk? Some risk factors for high blood pressure are under your control. The following factors may make you more likely to develop this condition:  Smoking.  Having type 2 diabetes mellitus, high cholesterol, or both.  Not getting enough exercise or physical activity.  Being overweight.  Having too much fat, sugar, calories, or salt (sodium) in your diet.  Drinking too much alcohol. Some risk factors for high blood pressure may be difficult or impossible to change. Some of these factors include:  Having chronic kidney disease.  Having a family history of high blood pressure.  Age. Risk increases with age.  Race. You may be at higher risk if you are African American.  Gender. Men are at higher risk than women before age 45. After age 65, women are at higher risk than men.  Having obstructive sleep apnea.  Stress. What are the signs or symptoms? High blood pressure may not cause symptoms. Very high blood pressure (hypertensive  crisis) may cause:  Headache.  Anxiety.  Shortness of breath.  Nosebleed.  Nausea and vomiting.  Vision changes.  Severe chest pain.  Seizures. How is this diagnosed? This condition is diagnosed by measuring your blood pressure while you are seated, with your arm resting on a flat surface, your legs uncrossed, and your feet flat on the floor. The cuff of the blood pressure monitor will be placed directly against the skin of your upper arm at the level of your heart. It should be measured at least twice using the same arm. Certain conditions can cause a difference in blood pressure between your right and left arms. Certain factors can cause blood pressure readings to be   lower or higher than normal for a short period of time:  When your blood pressure is higher when you are in a health care provider's office than when you are at home, this is called white coat hypertension. Most people with this condition do not need medicines.  When your blood pressure is higher at home than when you are in a health care provider's office, this is called masked hypertension. Most people with this condition may need medicines to control blood pressure. If you have a high blood pressure reading during one visit or you have normal blood pressure with other risk factors, you may be asked to:  Return on a different day to have your blood pressure checked again.  Monitor your blood pressure at home for 1 week or longer. If you are diagnosed with hypertension, you may have other blood or imaging tests to help your health care provider understand your overall risk for other conditions. How is this treated? This condition is treated by making healthy lifestyle changes, such as eating healthy foods, exercising more, and reducing your alcohol intake. Your health care provider may prescribe medicine if lifestyle changes are not enough to get your blood pressure under control, and if:  Your systolic blood pressure  is above 130.  Your diastolic blood pressure is above 80. Your personal target blood pressure may vary depending on your medical conditions, your age, and other factors. Follow these instructions at home: Eating and drinking   Eat a diet that is high in fiber and potassium, and low in sodium, added sugar, and fat. An example eating plan is called the DASH (Dietary Approaches to Stop Hypertension) diet. To eat this way: ? Eat plenty of fresh fruits and vegetables. Try to fill one half of your plate at each meal with fruits and vegetables. ? Eat whole grains, such as whole-wheat pasta, brown rice, or whole-grain bread. Fill about one fourth of your plate with whole grains. ? Eat or drink low-fat dairy products, such as skim milk or low-fat yogurt. ? Avoid fatty cuts of meat, processed or cured meats, and poultry with skin. Fill about one fourth of your plate with lean proteins, such as fish, chicken without skin, beans, eggs, or tofu. ? Avoid pre-made and processed foods. These tend to be higher in sodium, added sugar, and fat.  Reduce your daily sodium intake. Most people with hypertension should eat less than 1,500 mg of sodium a day.  Do not drink alcohol if: ? Your health care provider tells you not to drink. ? You are pregnant, may be pregnant, or are planning to become pregnant.  If you drink alcohol: ? Limit how much you use to:  0-1 drink a day for women.  0-2 drinks a day for men. ? Be aware of how much alcohol is in your drink. In the [place] one drink equals one 12 oz bottle of beer (355 mL), one 5 oz glass of wine (148 mL), or one 1 oz glass of hard liquor (44 mL). Lifestyle   Work with your health care provider to maintain a healthy body weight or to lose weight. Ask what an ideal weight is for you.  Get at least 30 minutes of exercise most days of the week. Activities may include walking, swimming, or biking.  Include exercise to strengthen your muscles (resistance  exercise), such as Pilates or lifting weights, as part of your weekly exercise routine. Try to do these types of exercises for 30 minutes at least   3 days a week.  Do not use any products that contain nicotine or tobacco, such as cigarettes, e-cigarettes, and chewing tobacco. If you need help quitting, ask your health care provider.  Monitor your blood pressure at home as told by your health care provider.  Keep all follow-up visits as told by your health care provider. This is important. Medicines  Take over-the-counter and prescription medicines only as told by your health care provider. Follow directions carefully. Blood pressure medicines must be taken as prescribed.  Do not skip doses of blood pressure medicine. Doing this puts you at risk for problems and can make the medicine less effective.  Ask your health care provider about side effects or reactions to medicines that you should watch for. Contact a health care provider if you:  Think you are having a reaction to a medicine you are taking.  Have headaches that keep coming back (recurring).  Feel dizzy.  Have swelling in your ankles.  Have trouble with your vision. Get help right away if you:  Develop a severe headache or confusion.  Have unusual weakness or numbness.  Feel faint.  Have severe pain in your chest or abdomen.  Vomit repeatedly.  Have trouble breathing. Summary  Hypertension is when the force of blood pumping through your arteries is too strong. If this condition is not controlled, it may put you at risk for serious complications.  Your personal target blood pressure may vary depending on your medical conditions, your age, and other factors. For most people, a normal blood pressure is less than 120/80.  Hypertension is treated with lifestyle changes, medicines, or a combination of both. Lifestyle changes include losing weight, eating a healthy, low-sodium diet, exercising more, and limiting  alcohol. This information is not intended to replace advice given to you by your health care provider. Make sure you discuss any questions you have with your health care provider. Document Released: 05/11/2005 Document Revised: 01/19/2018 Document Reviewed: 01/19/2018 Elsevier Patient Education  2020 Elsevier Inc.  

## 2019-02-17 LAB — COMPREHENSIVE METABOLIC PANEL
ALT: 24 IU/L (ref 0–32)
AST: 18 IU/L (ref 0–40)
Albumin/Globulin Ratio: 1.9 (ref 1.2–2.2)
Albumin: 4.8 g/dL (ref 3.8–4.8)
Alkaline Phosphatase: 124 IU/L — ABNORMAL HIGH (ref 39–117)
BUN/Creatinine Ratio: 16 (ref 9–23)
BUN: 13 mg/dL (ref 6–24)
Bilirubin Total: 0.5 mg/dL (ref 0.0–1.2)
CO2: 23 mmol/L (ref 20–29)
Calcium: 10 mg/dL (ref 8.7–10.2)
Chloride: 98 mmol/L (ref 96–106)
Creatinine, Ser: 0.81 mg/dL (ref 0.57–1.00)
GFR calc Af Amer: 99 mL/min/{1.73_m2} (ref >59)
GFR calc non Af Amer: 86 mL/min/{1.73_m2} (ref >59)
Globulin, Total: 2.5 g/dL (ref 1.5–4.5)
Glucose: 165 mg/dL — ABNORMAL HIGH (ref 65–99)
Potassium: 3.5 mmol/L (ref 3.5–5.2)
Sodium: 138 mmol/L (ref 134–144)
Total Protein: 7.3 g/dL (ref 6.0–8.5)

## 2019-02-17 LAB — CBC WITH DIFFERENTIAL/PLATELET
Basophils Absolute: 0.1 10*3/uL (ref 0.0–0.2)
Basos: 1 %
EOS (ABSOLUTE): 0.2 10*3/uL (ref 0.0–0.4)
Eos: 2 %
Hematocrit: 51.2 % — ABNORMAL HIGH (ref 34.0–46.6)
Hemoglobin: 16.9 g/dL — ABNORMAL HIGH (ref 11.1–15.9)
Immature Grans (Abs): 0 10*3/uL (ref 0.0–0.1)
Immature Granulocytes: 1 %
Lymphocytes Absolute: 2.1 10*3/uL (ref 0.7–3.1)
Lymphs: 25 %
MCH: 28.2 pg (ref 26.6–33.0)
MCHC: 33 g/dL (ref 31.5–35.7)
MCV: 85 fL (ref 79–97)
Monocytes Absolute: 0.5 10*3/uL (ref 0.1–0.9)
Monocytes: 6 %
Neutrophils Absolute: 5.5 10*3/uL (ref 1.4–7.0)
Neutrophils: 65 %
Platelets: 221 10*3/uL (ref 150–450)
RBC: 6 x10E6/uL — ABNORMAL HIGH (ref 3.77–5.28)
RDW: 13.8 % (ref 11.7–15.4)
WBC: 8.4 10*3/uL (ref 3.4–10.8)

## 2019-02-17 LAB — LIPID PANEL
Chol/HDL Ratio: 4.6 ratio — ABNORMAL HIGH (ref 0.0–4.4)
Cholesterol, Total: 188 mg/dL (ref 100–199)
HDL: 41 mg/dL (ref >39)
LDL Chol Calc (NIH): 107 mg/dL — ABNORMAL HIGH (ref 0–99)
Triglycerides: 232 mg/dL — ABNORMAL HIGH (ref 0–149)
VLDL Cholesterol Cal: 40 mg/dL (ref 5–40)

## 2019-02-17 LAB — HEMOGLOBIN A1C
Est. average glucose Bld gHb Est-mCnc: 209 mg/dL
Hgb A1c MFr Bld: 8.9 % — ABNORMAL HIGH (ref 4.8–5.6)

## 2019-02-17 LAB — MICROALBUMIN, URINE: Microalbumin, Urine: 79.5 ug/mL

## 2019-02-18 ENCOUNTER — Encounter: Payer: Self-pay | Admitting: Emergency Medicine

## 2019-03-26 ENCOUNTER — Other Ambulatory Visit: Payer: Self-pay | Admitting: Emergency Medicine

## 2019-03-26 ENCOUNTER — Encounter: Payer: Self-pay | Admitting: Emergency Medicine

## 2019-03-26 DIAGNOSIS — G4700 Insomnia, unspecified: Secondary | ICD-10-CM

## 2019-03-27 ENCOUNTER — Other Ambulatory Visit: Payer: Self-pay

## 2019-03-27 DIAGNOSIS — E1169 Type 2 diabetes mellitus with other specified complication: Secondary | ICD-10-CM

## 2019-03-27 DIAGNOSIS — E1165 Type 2 diabetes mellitus with hyperglycemia: Secondary | ICD-10-CM

## 2019-03-27 MED ORDER — ATORVASTATIN CALCIUM 40 MG PO TABS: 40.0000 mg | ORAL_TABLET | Freq: Every day | ORAL | 1 refills | Status: DC

## 2019-03-27 MED ORDER — TRULICITY 0.75 MG/0.5ML ~~LOC~~ SOAJ: 0.7500 mg | SUBCUTANEOUS | 5 refills | Status: DC

## 2019-03-27 NOTE — Telephone Encounter (Signed)
Patient is requesting a refill of the following medications: Requested Prescriptions   Pending Prescriptions Disp Refills  . zolpidem (AMBIEN) 10 MG tablet [Pharmacy Med Name: ZOLPIDEM TARTRATE 10 MG TABLET] 30 tablet 0    Sig: TAKE 1 TABLET BY MOUTH AT BEDTIME AS NEEDED FOR SLEEP    Date of patient request: 03/26/19 Last office visit: 02/06/19 Date of last refill: 02/06/19 Last refill amount: 30 Follow up time period per chart: 08/15/19

## 2019-03-27 NOTE — Telephone Encounter (Signed)
Requested medication (s) are due for refill today: yes  Requested medication (s) are on the active medication list: yes  Last refill:  02/06/2019  Future visit scheduled: yes  Notes to clinic:  Refill cannot be delegated    Requested Prescriptions  Pending Prescriptions Disp Refills   zolpidem (AMBIEN) 10 MG tablet [Pharmacy Med Name: ZOLPIDEM TARTRATE 10 MG TABLET] 30 tablet 0    Sig: TAKE 1 TABLET BY MOUTH AT BEDTIME AS NEEDED FOR SLEEP     Not Delegated - Psychiatry:  Anxiolytics/Hypnotics Failed - 03/26/2019  9:49 AM      Failed - This refill cannot be delegated      Failed - Urine Drug Screen completed in last 360 days.      Passed - Valid encounter within last 6 months    Recent Outpatient Visits          1 month ago Hypertension associated with diabetes [facility])   Primary Care at [facility], [place], MD   7 months ago Uncontrolled hypertension   Primary Care at [place], [place], [place]   10 months ago Type 2 diabetes mellitus without complication, without long-term current use of insulin [facility])   Primary Care at [facility], [place], MD   1 year ago Type 2 diabetes mellitus without complication, without long-term current use of insulin [facility])   Primary Care at [place], PA-C   1 year ago Type 2 diabetes mellitus without complication, without long-term current use of insulin [facility])   Primary Care at [place], [place]rie [place], PA-C      Future Appointments            In 4 months Sagardia, Ines Bloomer, MD Primary Care at [place], [facility]

## 2019-05-15 ENCOUNTER — Other Ambulatory Visit: Payer: Self-pay | Admitting: Emergency Medicine

## 2019-05-15 DIAGNOSIS — G4700 Insomnia, unspecified: Secondary | ICD-10-CM

## 2019-05-16 NOTE — Telephone Encounter (Signed)
Requested medication (s) are due for refill today: yes  Requested medication (s) are on the active medication list: yes  Last refill:  03/28/2019  Future visit scheduled: yes  Notes to clinic: This refill cannot be delegated    Requested Prescriptions  Pending Prescriptions Disp Refills   zolpidem (AMBIEN) 10 MG tablet [Pharmacy Med Name: ZOLPIDEM TARTRATE 10 MG TABLET] 30 tablet 0    Sig: TAKE 1 TABLET BY MOUTH EVERY DAY AT BEDTIME AS NEEDED FOR SLEEP      Not Delegated - Psychiatry:  Anxiolytics/Hypnotics Failed - 05/15/2019  7:07 PM      Failed - This refill cannot be delegated      Failed - Urine Drug Screen completed in last 360 days.      Passed - Valid encounter within last 6 months    Recent Outpatient Visits           2 months ago Hypertension associated with diabetes [facility])   Primary Care at [facility], [place], MD   9 months ago Uncontrolled hypertension   Primary Care at [place], [place], MD   1 year ago Type 2 diabetes mellitus without complication, without long-term current use of insulin [facility])   Primary Care at [facility], [place], MD   1 year ago Type 2 diabetes mellitus without complication, without long-term current use of insulin [facility])   Primary Care at [place], PA-C   1 year ago Type 2 diabetes mellitus without complication, without long-term current use of insulin [facility])   Primary Care at [place], PA-C       Future Appointments             In 3 months Sagardia, Ines Bloomer, MD Primary Care at [facility], [facility]

## 2019-06-28 ENCOUNTER — Other Ambulatory Visit: Payer: Self-pay | Admitting: Emergency Medicine

## 2019-06-28 DIAGNOSIS — G4700 Insomnia, unspecified: Secondary | ICD-10-CM

## 2019-06-28 NOTE — Telephone Encounter (Signed)
Notes to clinic:  This med is not able to be delegated.    Requested Prescriptions  Pending Prescriptions Disp Refills   zolpidem (AMBIEN) 10 MG tablet [Pharmacy Med Name: ZOLPIDEM TARTRATE 10 MG TABLET] 30 tablet 0    Sig: TAKE 1 TABLET BY MOUTH AT BEDTIME AS NEEDED FOR SLEEP      Not Delegated - Psychiatry:  Anxiolytics/Hypnotics Failed - 06/28/2019  7:23 PM      Failed - This refill cannot be delegated      Failed - Urine Drug Screen completed in last 360 days.      Passed - Valid encounter within last 6 months    Recent Outpatient Visits           4 months ago Hypertension associated with diabetes [facility])   Primary Care at [facility], [place], MD   10 months ago Uncontrolled hypertension   Primary Care at [place], [place], MD   1 year ago Type 2 diabetes mellitus without complication, without long-term current use of insulin [facility])   Primary Care at [facility], [place], MD   1 year ago Type 2 diabetes mellitus without complication, without long-term current use of insulin [facility])   Primary Care at [facility], Marolyn [place], PA-C   2 years ago Type 2 diabetes mellitus without complication, without long-term current use of insulin [facility])   Primary Care at [facility], Marolyn Hammock, PA-C       Future Appointments             In 1 month Sagardia, Eilleen Kempf, MD Primary Care at [place], [facility]

## 2019-07-20 LAB — HM MAMMOGRAPHY

## 2019-07-25 ENCOUNTER — Encounter: Payer: Self-pay | Admitting: *Deleted

## 2019-08-13 ENCOUNTER — Other Ambulatory Visit: Payer: Self-pay | Admitting: Emergency Medicine

## 2019-08-13 DIAGNOSIS — G4700 Insomnia, unspecified: Secondary | ICD-10-CM

## 2019-08-13 NOTE — Telephone Encounter (Signed)
Requested medication (s) are due for refill today: yes  Requested medication (s) are on the active medication list: yes  Last refill:  06/29/19  Future visit scheduled: yes  Notes to clinic:  medication not delegated to NT to refill   Requested Prescriptions  Pending Prescriptions Disp Refills   zolpidem (AMBIEN) 10 MG tablet [Pharmacy Med Name: ZOLPIDEM TARTRATE 10 MG TABLET] 30 tablet 0    Sig: TAKE 1 TABLET BY MOUTH EVERY DAY AT BEDTIME AS NEEDED FOR SLEEP      Not Delegated - Psychiatry:  Anxiolytics/Hypnotics Failed - 08/13/2019 10:15 AM      Failed - This refill cannot be delegated      Failed - Urine Drug Screen completed in last 360 days.      Passed - Valid encounter within last 6 months    Recent Outpatient Visits           5 months ago Hypertension associated with diabetes [facility])   Primary Care at [facility], Eilleen Kempf, MD   1 year ago Uncontrolled hypertension   Primary Care at [place], [place], MD   1 year ago Type 2 diabetes mellitus without complication, without long-term current use of insulin [facility])   Primary Care at [facility], [place], MD   1 year ago Type 2 diabetes mellitus without complication, without long-term current use of insulin [facility])   Primary Care at [facility], Marolyn Hammock, PA-C   2 years ago Type 2 diabetes mellitus without complication, without long-term current use of insulin [facility])   Primary Care at [facility], Marolyn Hammock, PA-C       Future Appointments             In 1 week Sagardia, Eilleen Kempf, MD Primary Care at [place], [facility]

## 2019-08-14 NOTE — Telephone Encounter (Signed)
Patient is requesting a refill of the following medications: Requested Prescriptions   Pending Prescriptions Disp Refills  . zolpidem (AMBIEN) 10 MG tablet [Pharmacy Med Name: ZOLPIDEM TARTRATE 10 MG TABLET] 30 tablet 0    Sig: TAKE 1 TABLET BY MOUTH EVERY DAY AT BEDTIME AS NEEDED FOR SLEEP    Date of patient request: 08/13/2019 Last office visit: 02/16/2019 Date of last refill: 06/29/2019 Last refill amount:30 tablets 0 refills  Follow up time period per chart: Follow up appointment scheduled

## 2019-08-15 ENCOUNTER — Ambulatory Visit: Payer: 59 | Admitting: Emergency Medicine

## 2019-08-22 ENCOUNTER — Other Ambulatory Visit: Payer: Self-pay

## 2019-08-22 ENCOUNTER — Ambulatory Visit: Payer: 59 | Admitting: Emergency Medicine

## 2019-08-22 ENCOUNTER — Encounter: Payer: Self-pay | Admitting: Emergency Medicine

## 2019-08-22 VITALS — BP 160/74 | HR 103 | Temp 97.8°F | Resp 16 | Ht 68.0 in | Wt 270.0 lb

## 2019-08-22 DIAGNOSIS — I10 Essential (primary) hypertension: Secondary | ICD-10-CM | POA: Diagnosis not present

## 2019-08-22 DIAGNOSIS — E1165 Type 2 diabetes mellitus with hyperglycemia: Secondary | ICD-10-CM | POA: Diagnosis not present

## 2019-08-22 DIAGNOSIS — E1159 Type 2 diabetes mellitus with other circulatory complications: Secondary | ICD-10-CM

## 2019-08-22 DIAGNOSIS — E785 Hyperlipidemia, unspecified: Secondary | ICD-10-CM

## 2019-08-22 DIAGNOSIS — E1169 Type 2 diabetes mellitus with other specified complication: Secondary | ICD-10-CM

## 2019-08-22 DIAGNOSIS — G4700 Insomnia, unspecified: Secondary | ICD-10-CM

## 2019-08-22 LAB — POCT GLYCOSYLATED HEMOGLOBIN (HGB A1C): Hemoglobin A1C: 7.5 % — AB (ref 4.0–5.6)

## 2019-08-22 LAB — GLUCOSE, POCT (MANUAL RESULT ENTRY): POC Glucose: 136 mg/dl — AB (ref 70–99)

## 2019-08-22 MED ORDER — AMLODIPINE BESYLATE 5 MG PO TABS: 5.0000 mg | ORAL_TABLET | Freq: Every day | ORAL | 3 refills | Status: DC

## 2019-08-22 MED ORDER — LOSARTAN POTASSIUM-HCTZ 100-25 MG PO TABS: 1.0000 | ORAL_TABLET | Freq: Every day | ORAL | 3 refills | Status: DC

## 2019-08-22 MED ORDER — TRULICITY 1.5 MG/0.5ML ~~LOC~~ SOAJ: 1.5000 mg | SUBCUTANEOUS | 3 refills | Status: AC

## 2019-08-22 MED ORDER — FARXIGA 10 MG PO TABS: 10.0000 mg | ORAL_TABLET | Freq: Every day | ORAL | 3 refills | Status: DC

## 2019-08-22 MED ORDER — ZOLPIDEM TARTRATE 10 MG PO TABS: ORAL_TABLET | ORAL | 1 refills | Status: DC

## 2019-08-22 MED ORDER — ATORVASTATIN CALCIUM 40 MG PO TABS: 40.0000 mg | ORAL_TABLET | Freq: Every day | ORAL | 3 refills | Status: DC

## 2019-08-22 NOTE — Progress Notes (Signed)
Patricia L Sullivant 49 y.o.   Chief Complaint  Patient presents with  . Diabetes  . Medication Refill    PEND  Last seen by me on 02/16/2019: ASSESSMENT & PLAN: Hypertension associated with diabetes ([place]) Patient discontinued glipizide due to severe GI side effects.  She is also intolerant to metformin.  Tolerating Farxiga 10 mg daily very well.  Will add Trulicity 6.21 mg weekly.  Diet and nutrition discussed with patient. Hypertension still uncontrolled.  Patient compliant with Hyzaar 100-25 mg daily.  Will add amlodipine 5 mg daily.  Still smoking with no intentions of stopping despite my strong advice to quit. Continue statin therapy with Lipitor 20 mg daily. Blood work done today while fasting.  HISTORY OF PRESENT ILLNESS: This is a 49 y.o. female with history of diabetes and hypertension here for follow-up. Compliant with medications. Has no complaints or medical concerns today.  HPI   Prior to Admission medications   Medication Sig Start Date End Date Taking? Authorizing Provider  amLODipine (NORVASC) 5 MG tablet Take 1 tablet (5 mg total) by mouth daily. 02/16/19  Yes Jaeven Wanzer, Ines Bloomer, MD  atorvastatin (LIPITOR) 40 MG tablet Take 1 tablet (40 mg total) by mouth daily. 03/27/19  Yes Nicolemarie Wooley, Ines Bloomer, MD  dapagliflozin propanediol (FARXIGA) 10 MG TABS tablet Take 10 mg by mouth daily. 02/16/19  Yes Laquentin Loudermilk, Ines Bloomer, MD  Dulaglutide (TRULICITY) 3.08 MV/7.8IO SOPN Inject 0.75 mg into the skin once a week. 03/27/19  Yes Grae Leathers, Ines Bloomer, MD  etonogestrel (IMPLANON) 68 MG IMPL implant Inject 1 each into the skin once.   Yes [provider]  zolpidem (AMBIEN) 10 MG tablet TAKE 1 TABLET BY MOUTH EVERY DAY AT BEDTIME AS NEEDED FOR SLEEP 08/14/19  Yes Navea Woodrow, Ines Bloomer, MD  glipiZIDE (GLUCOTROL XL) 5 MG 24 hr tablet Take 1 tablet (5 mg total) by mouth daily with breakfast. Patient not taking: Reported on 02/16/2019 09/06/18   Horald Pollen, MD   losartan-hydrochlorothiazide (HYZAAR) 100-25 MG tablet Take 1 tablet by mouth daily. 02/16/19 05/17/19  Horald Pollen, MD  metFORMIN (GLUCOPHAGE) 500 MG tablet Take 1 tablet (500 mg total) by mouth 2 (two) times daily with a meal. Patient not taking: Reported on 02/16/2019 08/12/18   Horald Pollen, MD    No Known Allergies  Patient Active Problem List   Diagnosis Date Noted  . Dyslipidemia 08/11/2018  . Onychomycosis 08/11/2018  . Morbid obesity with BMI of 40.0-44.9, adult ([place]) 08/11/2018  . Dyslipidemia associated with type 2 diabetes mellitus ([place]) 09/30/2017  . Poor sleep pattern 09/30/2017  . Type 2 diabetes mellitus without complication, without long-term current use of insulin ([place]) 06/25/2017  . Hypertension associated with diabetes ([place]) 03/22/2007    Past Medical History:  Diagnosis Date  . Allergy    seasonal  . Diabetes mellitus without complication ([place])   . Hyperlipidemia   . Hypertension     Past Surgical History:  Procedure Laterality Date  . TONSILLECTOMY    . WISDOM TOOTH EXTRACTION      Social History   Socioeconomic History  . Marital status: Married    Spouse name: Not on file  . Number of children: Not on file  . Years of education: Not on file  . Highest education level: Not on file  Occupational History  . Not on file  Tobacco Use  . Smoking status: Current Every Day Smoker    Packs/day: 0.50    Last attempt to quit: 08/23/2003  Years since quitting: 16.0  . Smokeless tobacco: Never Used  Substance and Sexual Activity  . Alcohol use: Yes    Alcohol/week: 0.0 standard drinks    Comment: occasionally  . Drug use: No  . Sexual activity: Yes    Birth control/protection: Implant  Other Topics Concern  . Not on file  Social History Narrative  . Not on file   Social Determinants of Health   Financial Resource Strain:   . Difficulty of Paying Living Expenses:   Food Insecurity:   . Worried About Programme researcher, broadcasting/film/video in  the Last Year:   . Barista in the Last Year:   Transportation Needs:   . Freight forwarder (Medical):   Marland Kitchen Lack of Transportation (Non-Medical):   Physical Activity:   . Days of Exercise per Week:   . Minutes of Exercise per Session:   Stress:   . Feeling of Stress :   Social Connections:   . Frequency of Communication with Friends and Family:   . Frequency of Social Gatherings with Friends and Family:   . Attends Religious Services:   . Active Member of Clubs or Organizations:   . Attends Banker Meetings:   Marland Kitchen Marital Status:   Intimate Partner Violence:   . Fear of Current or Ex-Partner:   . Emotionally Abused:   Marland Kitchen Physically Abused:   . Sexually Abused:     Family History  Problem Relation Age of Onset  . Diabetes Maternal Grandmother   . Colon cancer Mother      Review of Systems  Constitutional: Negative.  Negative for chills and fever.  HENT: Negative.  Negative for congestion and sore throat.   Respiratory: Negative.  Negative for cough and shortness of breath.   Cardiovascular: Negative.  Negative for chest pain and palpitations.  Gastrointestinal: Negative.  Negative for abdominal pain, blood in stool, melena, nausea and vomiting.  Genitourinary: Negative.  Negative for dysuria and hematuria.  Musculoskeletal: Negative.  Negative for back pain, myalgias and neck pain.  Skin: Negative.  Negative for rash.  Neurological: Negative.  Negative for dizziness and headaches.  All other systems reviewed and are negative.   Today's Vitals   08/22/19 0927  BP: (!) 171/92  Pulse: (!) 103  Resp: 16  Temp: 97.8 F (36.6 C)  TempSrc: Temporal  SpO2: 94%  Weight: 270 lb (122.5 kg)  Height:  (1.727 m)   Body mass index is 41.05 kg/m.  Physical Exam Vitals reviewed.  Constitutional:      Appearance: Normal appearance.  HENT:     Head: Normocephalic.  Eyes:     Extraocular Movements: Extraocular movements intact.      Conjunctiva/sclera: Conjunctivae normal.     Pupils: Pupils are equal, round, and reactive to light.  Cardiovascular:     Rate and Rhythm: Normal rate and regular rhythm.     Pulses: Normal pulses.     Heart sounds: Normal heart sounds.  Pulmonary:     Effort: Pulmonary effort is normal.     Breath sounds: Normal breath sounds.  Musculoskeletal:        General: Normal range of motion.     Cervical back: Normal range of motion and neck supple.  Skin:    General: Skin is warm and dry.     Capillary Refill: Capillary refill takes less than 2 seconds.  Neurological:     General: No focal deficit present.     Mental Status:  She is alert and oriented to person, place, and time.  Psychiatric:        Mood and Affect: Mood normal.        Behavior: Behavior normal.    Results for orders placed or performed in visit on 08/22/19 (from the past 24 hour(s))  POCT glucose (manual entry)     Status: Abnormal   Collection Time: 08/22/19  9:47 AM  Result Value Ref Range   POC Glucose 136 (A) 70 - 99 mg/dl  POCT glycosylated hemoglobin (Hb A1C)     Status: Abnormal   Collection Time: 08/22/19  9:52 AM  Result Value Ref Range   Hemoglobin A1C 7.5 (A) 4.0 - 5.6 %   HbA1c POC (<> result, manual entry)     HbA1c, POC (prediabetic range)     HbA1c, POC (controlled diabetic range)     A total of 30 minutes was spent with the patient, greater than 50% of which was in counseling/coordination of care regarding diabetes and hypertension, cardiovascular risks associated with these conditions, review of all medications and new changes, review of most recent office visit notes, review of most recent blood results including today's hemoglobin A1c, diet and nutrition, prognosis and need for follow-up.   ASSESSMENT & PLAN: Hypertension associated with diabetes ([facility]) Elevated blood pressure in the office with normal home readings.  Continue present medication, amlodipine 5 mg and Hyzaar 100-25 mg daily.  Improved diabetes with hemoglobin A1c better than last time at 7.5.  Responding well to [place].  We will increase Trulicity dose to 1.5 mg weekly and continue [place].  Diet and nutrition discussed.  Follow-up in 6 months.  Mullins was seen today for diabetes and medication refill.  Diagnoses and all orders for this visit:  Hypertension associated with diabetes (HCC) -     POCT glucose (manual entry) -     POCT glycosylated hemoglobin (Hb A1C) -     CBC with Differential/Platelet -     Comprehensive metabolic panel -     dapagliflozin propanediol (FARXIGA) 10 MG TABS tablet; Take 10 mg by mouth daily. -     losartan-hydrochlorothiazide (HYZAAR) 100-25 MG tablet; Take 1 tablet by mouth daily. -     Dulaglutide (TRULICITY) 1.5 MG/0.5ML SOPN; Inject 1.5 mg into the skin once a week.  Dyslipidemia associated with type 2 diabetes mellitus (HCC) -     Lipid panel -     atorvastatin (LIPITOR) 40 MG tablet; Take 1 tablet (40 mg total) by mouth daily.  Uncontrolled hypertension -     amLODipine (NORVASC) 5 MG tablet; Take 1 tablet (5 mg total) by mouth daily.  Type 2 diabetes mellitus with hyperglycemia, without long-term current use of insulin (HCC) -     HM Diabetes Foot Exam  Insomnia, unspecified type -     zolpidem (AMBIEN) 10 MG tablet; TAKE 1 TABLET BY MOUTH EVERY DAY AT BEDTIME AS NEEDED FOR SLEEP    Patient Instructions       If you have lab work done today you will be contacted with your lab results within the next 2 weeks.  If you have not heard from [place] then please contact us. The fastest way to get your results is to register for My Chart.   IF you received an x-ray today, you will receive an invoice from [facility]. Please contact [facility] at [phone] with questions or concerns regarding your invoice.   IF you received labwork today, you will receive  an Economist from [employer]. Please contact [facility] at [phone] with questions  or concerns regarding your invoice.   Our billing staff will not be able to assist you with questions regarding bills from these companies.  You will be contacted with the lab results as soon as they are available. The fastest way to get your results is to activate your My Chart account. Instructions are located on the last page of this paperwork. If you have not heard from [place] regarding the results in 2 weeks, please contact this office.     Diabetes Mellitus and Nutrition, Adult When you have diabetes (diabetes mellitus), it is very important to have healthy eating habits because your blood sugar (glucose) levels are greatly affected by what you eat and drink. Eating healthy foods in the appropriate amounts, at about the same times every day, can help you:  Control your blood glucose.  Lower your risk of heart disease.  Improve your blood pressure.  Reach or maintain a healthy weight. Every person with diabetes is different, and each person has different needs for a meal plan. Your health care provider may recommend that you work with a diet and nutrition specialist (dietitian) to make a meal plan that is best for you. Your meal plan may vary depending on factors such as:  The calories you need.  The medicines you take.  Your weight.  Your blood glucose, blood pressure, and cholesterol levels.  Your activity level.  Other health conditions you have, such as heart or kidney disease. How do carbohydrates affect me? Carbohydrates, also called carbs, affect your blood glucose level more than any other type of food. Eating carbs naturally raises the amount of glucose in your blood. Carb counting is a method for keeping track of how many carbs you eat. Counting carbs is important to keep your blood glucose at a healthy level, especially if you use insulin or take certain oral diabetes medicines. It is important to know how many carbs you can safely have in each meal. This is different for  every person. Your dietitian can help you calculate how many carbs you should have at each meal and for each snack. Foods that contain carbs include:  Bread, cereal, rice, pasta, and crackers.  Potatoes and corn.  Peas, beans, and lentils.  Milk and yogurt.  Fruit and juice.  Desserts, such as cakes, cookies, ice cream, and candy. How does alcohol affect me? Alcohol can cause a sudden decrease in blood glucose (hypoglycemia), especially if you use insulin or take certain oral diabetes medicines. Hypoglycemia can be a life-threatening condition. Symptoms of hypoglycemia (sleepiness, dizziness, and confusion) are similar to symptoms of having too much alcohol. If your health care provider says that alcohol is safe for you, follow these guidelines:  Limit alcohol intake to no more than 1 drink per day for nonpregnant women and 2 drinks per day for men. One drink equals 12 oz of beer, 5 oz of wine, or 1 oz of hard liquor.  Do not drink on an empty stomach.  Keep yourself hydrated with water, diet soda, or unsweetened iced tea.  Keep in mind that regular soda, juice, and other mixers may contain a lot of sugar and must be counted as carbs. What are tips for following this plan?  Reading food labels  Start by checking the serving size on the "Nutrition Facts" label of packaged foods and drinks. The amount of calories, carbs, fats, and other nutrients listed on the label is based  on one serving of the item. Many items contain more than one serving per package.  Check the total grams (g) of carbs in one serving. You can calculate the number of servings of carbs in one serving by dividing the total carbs by 15. For example, if a food has 30 g of total carbs, it would be equal to 2 servings of carbs.  Check the number of grams (g) of saturated and trans fats in one serving. Choose foods that have low or no amount of these fats.  Check the number of milligrams (mg) of salt (sodium) in one  serving. Most people should limit total sodium intake to less than 2,300 mg per day.  Always check the nutrition information of foods labeled as "low-fat" or "nonfat". These foods may be higher in added sugar or refined carbs and should be avoided.  Talk to your dietitian to identify your daily goals for nutrients listed on the label. Shopping  Avoid buying canned, premade, or processed foods. These foods tend to be high in fat, sodium, and added sugar.  Shop around the outside edge of the grocery store. This includes fresh fruits and vegetables, bulk grains, fresh meats, and fresh dairy. Cooking  Use low-heat cooking methods, such as baking, instead of high-heat cooking methods like deep frying.  Cook using healthy oils, such as olive, canola, or sunflower oil.  Avoid cooking with butter, cream, or high-fat meats. Meal planning  Eat meals and snacks regularly, preferably at the same times every day. Avoid going long periods of time without eating.  Eat foods high in fiber, such as fresh fruits, vegetables, beans, and whole grains. Talk to your dietitian about how many servings of carbs you can eat at each meal.  Eat 4-6 ounces (oz) of lean protein each day, such as lean meat, chicken, fish, eggs, or tofu. One oz of lean protein is equal to: ? 1 oz of meat, chicken, or fish. ? 1 egg. ?  cup of tofu.  Eat some foods each day that contain healthy fats, such as avocado, nuts, seeds, and fish. Lifestyle  Check your blood glucose regularly.  Exercise regularly as told by your health care provider. This may include: ? 150 minutes of moderate-intensity or vigorous-intensity exercise each week. This could be brisk walking, biking, or water aerobics. ? Stretching and doing strength exercises, such as yoga or weightlifting, at least 2 times a week.  Take medicines as told by your health care provider.  Do not use any products that contain nicotine or tobacco, such as cigarettes and  e-cigarettes. If you need help quitting, ask your health care provider.  Work with a Veterinary surgeoncounselor or diabetes educator to identify strategies to manage stress and any emotional and social challenges. Questions to ask a health care provider  Do I need to meet with a diabetes educator?  Do I need to meet with a dietitian?  What number can I call if I have questions?  When are the best times to check my blood glucose? Where to find more information:  American Diabetes Association: diabetes.org  Academy of Nutrition and Dietetics: www.eatright.[employer]org  National Institute of Diabetes and Digestive and Kidney Diseases (NIH): CarFlippers.tnwww.niddk.nih.gov Summary  A healthy meal plan will help you control your blood glucose and maintain a healthy lifestyle.  Working with a diet and nutrition specialist (dietitian) can help you make a meal plan that is best for you.  Keep in mind that carbohydrates (carbs) and alcohol have immediate effects on  your blood glucose levels. It is important to count carbs and to use alcohol carefully. This information is not intended to replace advice given to you by your health care provider. Make sure you discuss any questions you have with your health care provider. Document Revised: 04/23/2017 Document Reviewed: 06/15/2016 Elsevier Patient Education  2020 Elsevier Inc.      Edwina Barth, MD [facility]

## 2019-08-22 NOTE — Patient Instructions (Addendum)
   If you have lab work done today you will be contacted with your lab results within the next 2 weeks.  If you have not heard from us then please contact us. The fastest way to get your results is to register for My Chart.   IF you received an x-ray today, you will receive an invoice from [facility]. Please contact [facility] at [phone] with questions or concerns regarding your invoice.   IF you received labwork today, you will receive an invoice from [facility]. Please contact [facility] at [phone] with questions or concerns regarding your invoice.   Our billing staff will not be able to assist you with questions regarding bills from these companies.  You will be contacted with the lab results as soon as they are available. The fastest way to get your results is to activate your My Chart account. Instructions are located on the last page of this paperwork. If you have not heard from us regarding the results in 2 weeks, please contact this office.      Diabetes Mellitus and Nutrition, Adult When you have diabetes (diabetes mellitus), it is very important to have healthy eating habits because your blood sugar (glucose) levels are greatly affected by what you eat and drink. Eating healthy foods in the appropriate amounts, at about the same times every day, can help you:  Control your blood glucose.  Lower your risk of heart disease.  Improve your blood pressure.  Reach or maintain a healthy weight. Every person with diabetes is different, and each person has different needs for a meal plan. Your health care provider may recommend that you work with a diet and nutrition specialist (dietitian) to make a meal plan that is best for you. Your meal plan may vary depending on factors such as:  The calories you need.  The medicines you take.  Your weight.  Your blood glucose, blood pressure, and cholesterol levels.  Your activity level.  Other health  conditions you have, such as heart or kidney disease. How do carbohydrates affect me? Carbohydrates, also called carbs, affect your blood glucose level more than any other type of food. Eating carbs naturally raises the amount of glucose in your blood. Carb counting is a method for keeping track of how many carbs you eat. Counting carbs is important to keep your blood glucose at a healthy level, especially if you use insulin or take certain oral diabetes medicines. It is important to know how many carbs you can safely have in each meal. This is different for every person. Your dietitian can help you calculate how many carbs you should have at each meal and for each snack. Foods that contain carbs include:  Bread, cereal, rice, pasta, and crackers.  Potatoes and corn.  Peas, beans, and lentils.  Milk and yogurt.  Fruit and juice.  Desserts, such as cakes, cookies, ice cream, and candy. How does alcohol affect me? Alcohol can cause a sudden decrease in blood glucose (hypoglycemia), especially if you use insulin or take certain oral diabetes medicines. Hypoglycemia can be a life-threatening condition. Symptoms of hypoglycemia (sleepiness, dizziness, and confusion) are similar to symptoms of having too much alcohol. If your health care provider says that alcohol is safe for you, follow these guidelines:  Limit alcohol intake to no more than 1 drink per day for nonpregnant women and 2 drinks per day for men. One drink equals 12 oz of beer, 5 oz of wine, or 1 oz of hard   liquor.  Do not drink on an empty stomach.  Keep yourself hydrated with water, diet soda, or unsweetened iced tea.  Keep in mind that regular soda, juice, and other mixers may contain a lot of sugar and must be counted as carbs. What are tips for following this plan?  Reading food labels  Start by checking the serving size on the "Nutrition Facts" label of packaged foods and drinks. The amount of calories, carbs, fats, and  other nutrients listed on the label is based on one serving of the item. Many items contain more than one serving per package.  Check the total grams (g) of carbs in one serving. You can calculate the number of servings of carbs in one serving by dividing the total carbs by 15. For example, if a food has 30 g of total carbs, it would be equal to 2 servings of carbs.  Check the number of grams (g) of saturated and trans fats in one serving. Choose foods that have low or no amount of these fats.  Check the number of milligrams (mg) of salt (sodium) in one serving. Most people should limit total sodium intake to less than 2,300 mg per day.  Always check the nutrition information of foods labeled as "low-fat" or "nonfat". These foods may be higher in added sugar or refined carbs and should be avoided.  Talk to your dietitian to identify your daily goals for nutrients listed on the label. Shopping  Avoid buying canned, premade, or processed foods. These foods tend to be high in fat, sodium, and added sugar.  Shop around the outside edge of the grocery store. This includes fresh fruits and vegetables, bulk grains, fresh meats, and fresh dairy. Cooking  Use low-heat cooking methods, such as baking, instead of high-heat cooking methods like deep frying.  Cook using healthy oils, such as olive, canola, or sunflower oil.  Avoid cooking with butter, cream, or high-fat meats. Meal planning  Eat meals and snacks regularly, preferably at the same times every day. Avoid going long periods of time without eating.  Eat foods high in fiber, such as fresh fruits, vegetables, beans, and whole grains. Talk to your dietitian about how many servings of carbs you can eat at each meal.  Eat 4-6 ounces (oz) of lean protein each day, such as lean meat, chicken, fish, eggs, or tofu. One oz of lean protein is equal to: ? 1 oz of meat, chicken, or fish. ? 1 egg. ?  cup of tofu.  Eat some foods each day that  contain healthy fats, such as avocado, nuts, seeds, and fish. Lifestyle  Check your blood glucose regularly.  Exercise regularly as told by your health care provider. This may include: ? 150 minutes of moderate-intensity or vigorous-intensity exercise each week. This could be brisk walking, biking, or water aerobics. ? Stretching and doing strength exercises, such as yoga or weightlifting, at least 2 times a week.  Take medicines as told by your health care provider.  Do not use any products that contain nicotine or tobacco, such as cigarettes and e-cigarettes. If you need help quitting, ask your health care provider.  Work with a counselor or diabetes educator to identify strategies to manage stress and any emotional and social challenges. Questions to ask a health care provider  Do I need to meet with a diabetes educator?  Do I need to meet with a dietitian?  What number can I call if I have questions?  When are the best   times to check my blood glucose? Where to find more information:  American Diabetes Association: diabetes.org  Academy of Nutrition and Dietetics: www.eatright.org  National Institute of Diabetes and Digestive and Kidney Diseases (NIH): www.niddk.nih.gov Summary  A healthy meal plan will help you control your blood glucose and maintain a healthy lifestyle.  Working with a diet and nutrition specialist (dietitian) can help you make a meal plan that is best for you.  Keep in mind that carbohydrates (carbs) and alcohol have immediate effects on your blood glucose levels. It is important to count carbs and to use alcohol carefully. This information is not intended to replace advice given to you by your health care provider. Make sure you discuss any questions you have with your health care provider. Document Revised: 04/23/2017 Document Reviewed: 06/15/2016 Elsevier Patient Education  2020 Elsevier Inc.  

## 2019-08-22 NOTE — Assessment & Plan Note (Signed)
Elevated blood pressure in the office with normal home readings.  Continue present medication, amlodipine 5 mg and Hyzaar 100-25 mg daily. Improved diabetes with hemoglobin A1c better than last time at 7.5.  Responding well to [place].  We will increase Trulicity dose to 1.5 mg weekly and continue [place].  Diet and nutrition discussed.  Follow-up in 6 months.

## 2019-08-23 LAB — COMPREHENSIVE METABOLIC PANEL
ALT: 23 IU/L (ref 0–32)
AST: 21 IU/L (ref 0–40)
Albumin/Globulin Ratio: 2.3 — ABNORMAL HIGH (ref 1.2–2.2)
Albumin: 4.8 g/dL (ref 3.8–4.8)
Alkaline Phosphatase: 118 IU/L — ABNORMAL HIGH (ref 39–117)
BUN/Creatinine Ratio: 19 (ref 9–23)
BUN: 15 mg/dL (ref 6–24)
Bilirubin Total: 0.3 mg/dL (ref 0.0–1.2)
CO2: 21 mmol/L (ref 20–29)
Calcium: 9.7 mg/dL (ref 8.7–10.2)
Chloride: 102 mmol/L (ref 96–106)
Creatinine, Ser: 0.79 mg/dL (ref 0.57–1.00)
GFR calc Af Amer: 102 mL/min/{1.73_m2} (ref >59)
GFR calc non Af Amer: 88 mL/min/{1.73_m2} (ref >59)
Globulin, Total: 2.1 g/dL (ref 1.5–4.5)
Glucose: 140 mg/dL — ABNORMAL HIGH (ref 65–99)
Potassium: 3.9 mmol/L (ref 3.5–5.2)
Sodium: 141 mmol/L (ref 134–144)
Total Protein: 6.9 g/dL (ref 6.0–8.5)

## 2019-08-23 LAB — CBC WITH DIFFERENTIAL/PLATELET
Basophils Absolute: 0.1 10*3/uL (ref 0.0–0.2)
Basos: 1 %
EOS (ABSOLUTE): 0.2 10*3/uL (ref 0.0–0.4)
Eos: 2 %
Hematocrit: 50.4 % — ABNORMAL HIGH (ref 34.0–46.6)
Hemoglobin: 17.1 g/dL — ABNORMAL HIGH (ref 11.1–15.9)
Immature Grans (Abs): 0 10*3/uL (ref 0.0–0.1)
Immature Granulocytes: 0 %
Lymphocytes Absolute: 1.7 10*3/uL (ref 0.7–3.1)
Lymphs: 20 %
MCH: 29.4 pg (ref 26.6–33.0)
MCHC: 33.9 g/dL (ref 31.5–35.7)
MCV: 87 fL (ref 79–97)
Monocytes Absolute: 0.5 10*3/uL (ref 0.1–0.9)
Monocytes: 5 %
Neutrophils Absolute: 6.2 10*3/uL (ref 1.4–7.0)
Neutrophils: 72 %
Platelets: 201 10*3/uL (ref 150–450)
RBC: 5.82 x10E6/uL — ABNORMAL HIGH (ref 3.77–5.28)
RDW: 13.4 % (ref 11.7–15.4)
WBC: 8.6 10*3/uL (ref 3.4–10.8)

## 2019-08-23 LAB — LIPID PANEL
Chol/HDL Ratio: 3.6 ratio (ref 0.0–4.4)
Cholesterol, Total: 157 mg/dL (ref 100–199)
HDL: 44 mg/dL (ref >39)
LDL Chol Calc (NIH): 80 mg/dL (ref 0–99)
Triglycerides: 198 mg/dL — ABNORMAL HIGH (ref 0–149)
VLDL Cholesterol Cal: 33 mg/dL (ref 5–40)

## 2019-09-13 ENCOUNTER — Encounter: Payer: Self-pay | Admitting: Emergency Medicine

## 2019-09-14 ENCOUNTER — Other Ambulatory Visit: Payer: Self-pay | Admitting: Emergency Medicine

## 2019-09-14 DIAGNOSIS — B001 Herpesviral vesicular dermatitis: Secondary | ICD-10-CM

## 2019-09-14 MED ORDER — ACYCLOVIR 400 MG PO TABS: 400.0000 mg | ORAL_TABLET | Freq: Three times a day (TID) | ORAL | 5 refills | Status: DC

## 2019-09-14 NOTE — Telephone Encounter (Signed)
No, she does not need an OV. I will prescribe Acyclovir 400 mg 3 times a day for 5 days to treat recurring episodes. Thanks.

## 2019-09-14 NOTE — Telephone Encounter (Signed)
Pt wants a prescription for cold sore medication, does she need an OV first?

## 2019-09-20 ENCOUNTER — Other Ambulatory Visit: Payer: Self-pay | Admitting: Emergency Medicine

## 2019-09-20 DIAGNOSIS — E1165 Type 2 diabetes mellitus with hyperglycemia: Secondary | ICD-10-CM

## 2019-10-04 ENCOUNTER — Other Ambulatory Visit: Payer: Self-pay | Admitting: Emergency Medicine

## 2019-10-04 DIAGNOSIS — G4700 Insomnia, unspecified: Secondary | ICD-10-CM

## 2019-10-04 NOTE — Telephone Encounter (Signed)
Requested medication (s) are due for refill today: yes  Requested medication (s) are on the active medication list: yes  Last refill:  08/14/19  Future visit scheduled: yes  Notes to clinic:  not delegated    Requested Prescriptions  Pending Prescriptions Disp Refills   zolpidem (AMBIEN) 10 MG tablet [Pharmacy Med Name: ZOLPIDEM TARTRATE 10 MG TABLET] 30 tablet 0    Sig: TAKE 1 TABLET BY MOUTH AT BEDTIME AS NEEDED FOR SLEEP      Not Delegated - Psychiatry:  Anxiolytics/Hypnotics Failed - 10/04/2019 11:31 PM      Failed - This refill cannot be delegated      Failed - Urine Drug Screen completed in last 360 days.      Passed - Valid encounter within last 6 months    Recent Outpatient Visits           1 month ago Hypertension associated with diabetes [facility])   Primary Care at [facility], [place], [place]   7 months ago Hypertension associated with diabetes [facility])   Primary Care at [facility], Eilleen Kempf, MD   1 year ago Uncontrolled hypertension   Primary Care at [place], [place], MD   1 year ago Type 2 diabetes mellitus without complication, without long-term current use of insulin [facility])   Primary Care at [place], [place], MD   2 years ago Type 2 diabetes mellitus without complication, without long-term current use of insulin [facility])   [facility] at [facility], Marolyn Hammock, PA-C       Future Appointments             In 4 months Sagardia, Eilleen Kempf, MD Primary Care at [place], [facility]

## 2019-10-05 ENCOUNTER — Other Ambulatory Visit: Payer: Self-pay | Admitting: Emergency Medicine

## 2019-10-06 NOTE — Telephone Encounter (Signed)
Patient is requesting a refill of the following medications: Requested Prescriptions   Pending Prescriptions Disp Refills   zolpidem (AMBIEN) 10 MG tablet [Pharmacy Med Name: ZOLPIDEM TARTRATE 10 MG TABLET] 30 tablet 0    Sig: TAKE 1 TABLET BY MOUTH AT BEDTIME AS NEEDED FOR SLEEP    Date of patient request: 10/04/2019 Last office visit: 08/22/19 Date of last refill: 08/22/19 Last refill amount: 30 Follow up time period per chart: 02/26/20

## 2019-11-21 ENCOUNTER — Other Ambulatory Visit: Payer: Self-pay | Admitting: Emergency Medicine

## 2019-11-21 DIAGNOSIS — G4700 Insomnia, unspecified: Secondary | ICD-10-CM

## 2019-11-21 NOTE — Telephone Encounter (Signed)
Requested medications are due for refill today?  Yes - This refill cannot be delegated.    Requested medications are on active medication list?  Yes  Last Refill:   10/06/2019  # 30 with no refills  Future visit scheduled?  Yes  Notes to Clinic:  This refill cannot be delegated.

## 2019-11-21 NOTE — Telephone Encounter (Signed)
Patient is requesting a refill of the following medications: Requested Prescriptions   Pending Prescriptions Disp Refills   zolpidem (AMBIEN) 10 MG tablet [Pharmacy Med Name: ZOLPIDEM TARTRATE 10 MG TABLET] 30 tablet 0    Sig: TAKE 1 TABLET BY MOUTH EVERY DAY AT BEDTIME AS NEEDED FOR SLEEP    Date of patient request: 11/21/19 Last office visit: 08/22/19 Date of last refill: 10/06/19 Last refill amount: 30-0rf Follow up time period per chart: 02/26/20

## 2020-01-13 ENCOUNTER — Other Ambulatory Visit: Payer: Self-pay | Admitting: Emergency Medicine

## 2020-01-13 DIAGNOSIS — G4700 Insomnia, unspecified: Secondary | ICD-10-CM

## 2020-01-13 DIAGNOSIS — B001 Herpesviral vesicular dermatitis: Secondary | ICD-10-CM

## 2020-01-13 NOTE — Telephone Encounter (Signed)
Requested medication (s) are due for refill today: yes  Requested medication (s) are on the active medication list: yes  Last refill:  zolpidem: 11/21/19       acyclovir: 09/14/19  Future visit scheduled: yes  Notes to clinic:  Ambien not delegated to RF acyclovir expired 09/19/19   Requested Prescriptions  Pending Prescriptions Disp Refills   zolpidem (AMBIEN) 10 MG tablet [Pharmacy Med Name: ZOLPIDEM TARTRATE 10 MG TABLET] 30 tablet 0    Sig: TAKE 1 TABLET BY MOUTH AT BEDTIME AS NEEDED FOR SLEEP      Not Delegated - Psychiatry:  Anxiolytics/Hypnotics Failed - 01/13/2020 11:53 AM      Failed - This refill cannot be delegated      Failed - Urine Drug Screen completed in last 360 days.      Passed - Valid encounter within last 6 months    Recent Outpatient Visits           4 months ago Hypertension associated with diabetes [facility])   Primary Care at [facility], [place], MD   11 months ago Hypertension associated with diabetes [facility])   Primary Care at [facility], Eilleen Kempf, MD   1 year ago Uncontrolled hypertension   Primary Care at [place], [place], MD   1 year ago Type 2 diabetes mellitus without complication, without long-term current use of insulin [facility])   Primary Care at [facility], [place], MD   2 years ago Type 2 diabetes mellitus without complication, without long-term current use of insulin [facility])   [facility] Care at [facility], Marolyn Hammock, PA-C       Future Appointments             In 1 month Sagardia, Eilleen Kempf, MD Primary Care at [place], [facility]              acyclovir (ZOVIRAX) 400 MG tablet [Pharmacy Med Name: ACYCLOVIR 400 MG TABLET] 15 tablet 5    Sig: TAKE 1 TABLET BY MOUTH 3 TIMES A DAY X 5 DAYS AS NEEDED FOR RECURRENT EPISODES      Antimicrobials:  Antiviral Agents - Anti-Herpetic Passed - 01/13/2020 11:53 AM      Passed - Valid encounter within last 12 months    Recent Outpatient Visits           4 months ago  Hypertension associated with diabetes [facility])   Primary Care at [facility], [place], MD   11 months ago Hypertension associated with diabetes [facility])   Primary Care at [facility], Eilleen Kempf, MD   1 year ago Uncontrolled hypertension   Primary Care at [place], [place], MD   1 year ago Type 2 diabetes mellitus without complication, without long-term current use of insulin [facility])   Primary Care at [place], [place], MD   2 years ago Type 2 diabetes mellitus without complication, without long-term current use of insulin [facility])   Primary Care at [facility], Marolyn Hammock, PA-C       Future Appointments             In 1 month Sagardia, Eilleen Kempf, MD Primary Care at [place], [facility]

## 2020-01-15 NOTE — Telephone Encounter (Signed)
Patient is requesting a refill of the following medications: Requested Prescriptions   Pending Prescriptions Disp Refills   zolpidem (AMBIEN) 10 MG tablet [Pharmacy Med Name: ZOLPIDEM TARTRATE 10 MG TABLET] 30 tablet 0    Sig: TAKE 1 TABLET BY MOUTH AT BEDTIME AS NEEDED FOR SLEEP   acyclovir (ZOVIRAX) 400 MG tablet [Pharmacy Med Name: ACYCLOVIR 400 MG TABLET] 15 tablet 5    Sig: TAKE 1 TABLET BY MOUTH 3 TIMES A DAY X 5 DAYS AS NEEDED FOR RECURRENT EPISODES    Date of patient request: 01/13/20 Last office visit: 08/22/19 Date of last refill: 11/21/19 Last refill amount:30 Follow up time period per chart: 02/26/20

## 2020-01-23 ENCOUNTER — Ambulatory Visit: Payer: Self-pay | Attending: Internal Medicine

## 2020-01-23 DIAGNOSIS — Z23 Encounter for immunization: Secondary | ICD-10-CM

## 2020-01-23 NOTE — Progress Notes (Signed)
   Covid-19 Vaccination Clinic  Name:  [name]    MRN: [id number] DOB: 1970-08-27  01/23/2020  [name] was observed post Covid-19 immunization for 15 minutes without incident. She was provided with Vaccine Information Sheet and instruction to access the V-Safe system.   [name] was instructed to call 911 with any severe reactions post vaccine: Marland Kitchen Difficulty breathing  . Swelling of face and throat  . A fast heartbeat  . A bad rash all over body  . Dizziness and weakness   Immunizations Administered    Name Date Dose VIS Date Route   JANSSEN COVID-19 VACCINE 01/23/2020 12:50 PM 0.5 mL 07/22/2019 Intramuscular   Manufacturer: [employer]   Lot: [id number]   NDC: 30092-330-07

## 2020-02-26 ENCOUNTER — Ambulatory Visit: Payer: 59 | Admitting: Emergency Medicine

## 2020-02-29 ENCOUNTER — Telehealth: Payer: Self-pay | Admitting: Emergency Medicine

## 2020-02-29 NOTE — Telephone Encounter (Signed)
Pt called per CRM . LVM for patient  to call back and reschedule upcoming appt per her request

## 2020-03-06 ENCOUNTER — Ambulatory Visit: Payer: 59 | Admitting: Emergency Medicine

## 2020-03-17 ENCOUNTER — Other Ambulatory Visit: Payer: Self-pay | Admitting: Emergency Medicine

## 2020-03-17 DIAGNOSIS — G4700 Insomnia, unspecified: Secondary | ICD-10-CM

## 2020-03-17 NOTE — Telephone Encounter (Signed)
Requested medication (s) are due for refill today:no  Requested medication (s) are on the active medication list: yes  Last refill:  01/15/2020  Future visit scheduled: yes  Notes to clinic:  This refill cannot be delegated   Requested Prescriptions  Pending Prescriptions Disp Refills   zolpidem (AMBIEN) 10 MG tablet [Pharmacy Med Name: ZOLPIDEM TARTRATE 10 MG TABLET] 30 tablet 0    Sig: TAKE 1 TABLET BY MOUTH EVERY DAY AT BEDTIME AS NEEDED FOR SLEEP      Not Delegated - Psychiatry:  Anxiolytics/Hypnotics Failed - 03/17/2020  9:35 PM      Failed - This refill cannot be delegated      Failed - Urine Drug Screen completed in last 360 days.      Failed - Valid encounter within last 6 months    Recent Outpatient Visits           6 months ago Hypertension associated with diabetes [facility])   Primary Care at [facility], Eilleen Kempf, MD   1 year ago Hypertension associated with diabetes [facility])   Primary Care at [facility], Eilleen Kempf, MD   1 year ago Uncontrolled hypertension   Primary Care at [place], [place], MD   1 year ago Type 2 diabetes mellitus without complication, without long-term current use of insulin [facility])   Primary Care at [place], [place], MD   2 years ago Type 2 diabetes mellitus without complication, without long-term current use of insulin [facility])   [facility] at [facility], Marolyn Hammock, PA-C       Future Appointments             In 3 weeks Sagardia, Eilleen Kempf, MD Primary Care at [place], [facility]

## 2020-03-18 NOTE — Telephone Encounter (Signed)
Patient is requesting a refill of the following medications: Requested Prescriptions   Pending Prescriptions Disp Refills   zolpidem (AMBIEN) 10 MG tablet [Pharmacy Med Name: ZOLPIDEM TARTRATE 10 MG TABLET] 30 tablet 0    Sig: TAKE 1 TABLET BY MOUTH EVERY DAY AT BEDTIME AS NEEDED FOR SLEEP    Date of patient request: 03/18/20 Last office visit: 08/22/19 Date of last refill: 01/15/20 Last refill amount: 30 Follow up time period per chart:04/11/20

## 2020-03-18 NOTE — Telephone Encounter (Signed)
Below msg for control refill

## 2020-04-11 ENCOUNTER — Other Ambulatory Visit: Payer: Self-pay

## 2020-04-11 ENCOUNTER — Ambulatory Visit: Payer: 59 | Admitting: Emergency Medicine

## 2020-04-11 ENCOUNTER — Encounter: Payer: Self-pay | Admitting: Emergency Medicine

## 2020-04-11 VITALS — BP 148/80 | HR 115 | Temp 98.5°F | Resp 16 | Ht 68.0 in | Wt 262.4 lb

## 2020-04-11 DIAGNOSIS — I10 Essential (primary) hypertension: Secondary | ICD-10-CM

## 2020-04-11 DIAGNOSIS — E1165 Type 2 diabetes mellitus with hyperglycemia: Secondary | ICD-10-CM | POA: Diagnosis not present

## 2020-04-11 DIAGNOSIS — E1169 Type 2 diabetes mellitus with other specified complication: Secondary | ICD-10-CM

## 2020-04-11 DIAGNOSIS — I152 Hypertension secondary to endocrine disorders: Secondary | ICD-10-CM

## 2020-04-11 DIAGNOSIS — Z6841 Body Mass Index (BMI) 40.0 and over, adult: Secondary | ICD-10-CM

## 2020-04-11 DIAGNOSIS — Z6839 Body mass index (BMI) 39.0-39.9, adult: Secondary | ICD-10-CM | POA: Diagnosis not present

## 2020-04-11 DIAGNOSIS — E1159 Type 2 diabetes mellitus with other circulatory complications: Secondary | ICD-10-CM | POA: Diagnosis not present

## 2020-04-11 DIAGNOSIS — E785 Hyperlipidemia, unspecified: Secondary | ICD-10-CM

## 2020-04-11 LAB — COMPREHENSIVE METABOLIC PANEL
ALT: 28 IU/L (ref 0–32)
AST: 21 IU/L (ref 0–40)
Albumin/Globulin Ratio: 1.9 (ref 1.2–2.2)
Albumin: 4.8 g/dL (ref 3.8–4.8)
Alkaline Phosphatase: 119 IU/L (ref 44–121)
BUN/Creatinine Ratio: 14 (ref 9–23)
BUN: 13 mg/dL (ref 6–24)
Bilirubin Total: 0.6 mg/dL (ref 0.0–1.2)
CO2: 23 mmol/L (ref 20–29)
Calcium: 10.8 mg/dL — ABNORMAL HIGH (ref 8.7–10.2)
Chloride: 98 mmol/L (ref 96–106)
Creatinine, Ser: 0.91 mg/dL (ref 0.57–1.00)
GFR calc Af Amer: 85 mL/min/{1.73_m2} (ref >59)
GFR calc non Af Amer: 74 mL/min/{1.73_m2} (ref >59)
Globulin, Total: 2.5 g/dL (ref 1.5–4.5)
Glucose: 135 mg/dL — ABNORMAL HIGH (ref 65–99)
Potassium: 3.3 mmol/L — ABNORMAL LOW (ref 3.5–5.2)
Sodium: 139 mmol/L (ref 134–144)
Total Protein: 7.3 g/dL (ref 6.0–8.5)

## 2020-04-11 LAB — LIPID PANEL
Chol/HDL Ratio: 3.9 ratio (ref 0.0–4.4)
Cholesterol, Total: 148 mg/dL (ref 100–199)
HDL: 38 mg/dL — ABNORMAL LOW (ref >39)
LDL Chol Calc (NIH): 77 mg/dL (ref 0–99)
Triglycerides: 196 mg/dL — ABNORMAL HIGH (ref 0–149)
VLDL Cholesterol Cal: 33 mg/dL (ref 5–40)

## 2020-04-11 LAB — CBC WITH DIFFERENTIAL/PLATELET
Basophils Absolute: 0.1 10*3/uL (ref 0.0–0.2)
Basos: 1 %
EOS (ABSOLUTE): 0.2 10*3/uL (ref 0.0–0.4)
Eos: 2 %
Hematocrit: 52.4 % — ABNORMAL HIGH (ref 34.0–46.6)
Hemoglobin: 17.3 g/dL — ABNORMAL HIGH (ref 11.1–15.9)
Immature Grans (Abs): 0 10*3/uL (ref 0.0–0.1)
Immature Granulocytes: 0 %
Lymphocytes Absolute: 2.2 10*3/uL (ref 0.7–3.1)
Lymphs: 24 %
MCH: 29 pg (ref 26.6–33.0)
MCHC: 33 g/dL (ref 31.5–35.7)
MCV: 88 fL (ref 79–97)
Monocytes Absolute: 0.6 10*3/uL (ref 0.1–0.9)
Monocytes: 6 %
Neutrophils Absolute: 6.3 10*3/uL (ref 1.4–7.0)
Neutrophils: 67 %
Platelets: 228 10*3/uL (ref 150–450)
RBC: 5.97 x10E6/uL — ABNORMAL HIGH (ref 3.77–5.28)
RDW: 13.4 % (ref 11.7–15.4)
WBC: 9.4 10*3/uL (ref 3.4–10.8)

## 2020-04-11 LAB — POCT GLYCOSYLATED HEMOGLOBIN (HGB A1C): Hemoglobin A1C: 7.4 % — AB (ref 4.0–5.6)

## 2020-04-11 LAB — GLUCOSE, POCT (MANUAL RESULT ENTRY): POC Glucose: 144 mg/dl — AB (ref 70–99)

## 2020-04-11 MED ORDER — LOSARTAN POTASSIUM-HCTZ 100-25 MG PO TABS: 1.0000 | ORAL_TABLET | Freq: Every day | ORAL | 3 refills | Status: AC

## 2020-04-11 MED ORDER — AMLODIPINE BESYLATE 5 MG PO TABS: 5.0000 mg | ORAL_TABLET | Freq: Every day | ORAL | 3 refills | Status: AC

## 2020-04-11 MED ORDER — ATORVASTATIN CALCIUM 40 MG PO TABS: 40.0000 mg | ORAL_TABLET | Freq: Every day | ORAL | 3 refills | Status: AC

## 2020-04-11 MED ORDER — DAPAGLIFLOZIN PROPANEDIOL 10 MG PO TABS: 10.0000 mg | ORAL_TABLET | Freq: Every day | ORAL | 3 refills | Status: AC

## 2020-04-11 NOTE — Patient Instructions (Addendum)
   If you have lab work done today you will be contacted with your lab results within the next 2 weeks.  If you have not heard from us then please contact us. The fastest way to get your results is to register for My Chart.   IF you received an x-ray today, you will receive an invoice from [facility]. Please contact  Radiology at [phone] with questions or concerns regarding your invoice.   IF you received labwork today, you will receive an invoice from [facility]. Please contact [facility] at [phone] with questions or concerns regarding your invoice.   Our billing staff will not be able to assist you with questions regarding bills from these companies.  You will be contacted with the lab results as soon as they are available. The fastest way to get your results is to activate your My Chart account. Instructions are located on the last page of this paperwork. If you have not heard from us regarding the results in 2 weeks, please contact this office.      Diabetes Mellitus and Nutrition, Adult When you have diabetes (diabetes mellitus), it is very important to have healthy eating habits because your blood sugar (glucose) levels are greatly affected by what you eat and drink. Eating healthy foods in the appropriate amounts, at about the same times every day, can help you:  Control your blood glucose.  Lower your risk of heart disease.  Improve your blood pressure.  Reach or maintain a healthy weight. Every person with diabetes is different, and each person has different needs for a meal plan. Your health care provider may recommend that you work with a diet and nutrition specialist (dietitian) to make a meal plan that is best for you. Your meal plan may vary depending on factors such as:  The calories you need.  The medicines you take.  Your weight.  Your blood glucose, blood pressure, and cholesterol levels.  Your activity level.  Other health  conditions you have, such as heart or kidney disease. How do carbohydrates affect me? Carbohydrates, also called carbs, affect your blood glucose level more than any other type of food. Eating carbs naturally raises the amount of glucose in your blood. Carb counting is a method for keeping track of how many carbs you eat. Counting carbs is important to keep your blood glucose at a healthy level, especially if you use insulin or take certain oral diabetes medicines. It is important to know how many carbs you can safely have in each meal. This is different for every person. Your dietitian can help you calculate how many carbs you should have at each meal and for each snack. Foods that contain carbs include:  Bread, cereal, rice, pasta, and crackers.  Potatoes and corn.  Peas, beans, and lentils.  Milk and yogurt.  Fruit and juice.  Desserts, such as cakes, cookies, ice cream, and candy. How does alcohol affect me? Alcohol can cause a sudden decrease in blood glucose (hypoglycemia), especially if you use insulin or take certain oral diabetes medicines. Hypoglycemia can be a life-threatening condition. Symptoms of hypoglycemia (sleepiness, dizziness, and confusion) are similar to symptoms of having too much alcohol. If your health care provider says that alcohol is safe for you, follow these guidelines:  Limit alcohol intake to no more than 1 drink per day for nonpregnant women and 2 drinks per day for men. One drink equals 12 oz of beer, 5 oz of wine, or 1 oz of hard   liquor.  Do not drink on an empty stomach.  Keep yourself hydrated with water, diet soda, or unsweetened iced tea.  Keep in mind that regular soda, juice, and other mixers may contain a lot of sugar and must be counted as carbs. What are tips for following this plan?  Reading food labels  Start by checking the serving size on the "Nutrition Facts" label of packaged foods and drinks. The amount of calories, carbs, fats, and  other nutrients listed on the label is based on one serving of the item. Many items contain more than one serving per package.  Check the total grams (g) of carbs in one serving. You can calculate the number of servings of carbs in one serving by dividing the total carbs by 15. For example, if a food has 30 g of total carbs, it would be equal to 2 servings of carbs.  Check the number of grams (g) of saturated and trans fats in one serving. Choose foods that have low or no amount of these fats.  Check the number of milligrams (mg) of salt (sodium) in one serving. Most people should limit total sodium intake to less than 2,300 mg per day.  Always check the nutrition information of foods labeled as "low-fat" or "nonfat". These foods may be higher in added sugar or refined carbs and should be avoided.  Talk to your dietitian to identify your daily goals for nutrients listed on the label. Shopping  Avoid buying canned, premade, or processed foods. These foods tend to be high in fat, sodium, and added sugar.  Shop around the outside edge of the grocery store. This includes fresh fruits and vegetables, bulk grains, fresh meats, and fresh dairy. Cooking  Use low-heat cooking methods, such as baking, instead of high-heat cooking methods like deep frying.  Cook using healthy oils, such as olive, canola, or sunflower oil.  Avoid cooking with butter, cream, or high-fat meats. Meal planning  Eat meals and snacks regularly, preferably at the same times every day. Avoid going long periods of time without eating.  Eat foods high in fiber, such as fresh fruits, vegetables, beans, and whole grains. Talk to your dietitian about how many servings of carbs you can eat at each meal.  Eat 4-6 ounces (oz) of lean protein each day, such as lean meat, chicken, fish, eggs, or tofu. One oz of lean protein is equal to: ? 1 oz of meat, chicken, or fish. ? 1 egg. ?  cup of tofu.  Eat some foods each day that  contain healthy fats, such as avocado, nuts, seeds, and fish. Lifestyle  Check your blood glucose regularly.  Exercise regularly as told by your health care provider. This may include: ? 150 minutes of moderate-intensity or vigorous-intensity exercise each week. This could be brisk walking, biking, or water aerobics. ? Stretching and doing strength exercises, such as yoga or weightlifting, at least 2 times a week.  Take medicines as told by your health care provider.  Do not use any products that contain nicotine or tobacco, such as cigarettes and e-cigarettes. If you need help quitting, ask your health care provider.  Work with a counselor or diabetes educator to identify strategies to manage stress and any emotional and social challenges. Questions to ask a health care provider  Do I need to meet with a diabetes educator?  Do I need to meet with a dietitian?  What number can I call if I have questions?  When are the best   times to check my blood glucose? Where to find more information:  American Diabetes Association: diabetes.org  Academy of Nutrition and Dietetics: www.eatright.org  National Institute of Diabetes and Digestive and Kidney Diseases (NIH): www.niddk.nih.gov Summary  A healthy meal plan will help you control your blood glucose and maintain a healthy lifestyle.  Working with a diet and nutrition specialist (dietitian) can help you make a meal plan that is best for you.  Keep in mind that carbohydrates (carbs) and alcohol have immediate effects on your blood glucose levels. It is important to count carbs and to use alcohol carefully. This information is not intended to replace advice given to you by your health care provider. Make sure you discuss any questions you have with your health care provider. Document Revised: 04/23/2017 Document Reviewed: 06/15/2016 Elsevier Patient Education  2020 [employer]  

## 2020-04-11 NOTE — Assessment & Plan Note (Signed)
Well-controlled hypertension with normal blood pressure readings at home. Continue present medication at home with amlodipine and Hyzaar. Diabetes improving with hemoglobin A1c at 7.4. Continue Trulicity and [place]. Intolerant to Metformin and glipizide. Continue statin therapy with atorvastatin 40 mg daily. Diet and nutrition discussed. Follow-up in 3 months.

## 2020-04-11 NOTE — Progress Notes (Signed)
[name] 49 y.o.   Chief Complaint  Patient presents with  . Diabetes    follow up 6 month  . Medication Refill    Pend    HISTORY OF PRESENT ILLNESS: This is a [age] y.o. female with history of diabetes, hypertension, dyslipidemia, here for follow-up and medication refill. Doing well. Has no complaints or medical concerns today. Tripped and fell about 3 weeks ago and injured her right elbow, still complaining of pain.  HPI   Prior to Admission medications   Medication Sig Start Date End Date Taking? Authorizing Provider  acyclovir (ZOVIRAX) 400 MG tablet TAKE 1 TABLET BY MOUTH 3 TIMES A DAY X 5 DAYS AS NEEDED FOR RECURRENT EPISODES 01/15/20  Yes Michelene Keniston, Eilleen KempfMiguel Jose, MD  amLODipine (NORVASC) 5 MG tablet Take 1 tablet (5 mg total) by mouth daily. 08/22/19  Yes Stayce Delancy, Eilleen KempfMiguel Jose, MD  Apple Cid Vn-Grn Tea-Bit Or-Cr (APPLE CIDER VINEGAR PLUS) TABS Take by mouth daily.   Yes [provider]  Ascorbic Acid (VITAMIN C) 1000 MG tablet Take 1,000 mg by mouth daily.   Yes [provider]  atorvastatin (LIPITOR) 40 MG tablet Take 1 tablet (40 mg total) by mouth daily. 08/22/19  Yes Noga Fogg, Eilleen KempfMiguel Jose, MD  Cholecalciferol (VITAMIN D3) 1.25 MG (50000 UT) CAPS Take by mouth daily.   Yes [provider]  Coenzyme Q10 (COQ10) 200 MG CAPS Take by mouth daily.   Yes [provider]  Cyanocobalamin (VITAMIN B 12 PO) Take by mouth daily.   Yes [provider]  dapagliflozin propanediol (FARXIGA) 10 MG TABS tablet Take 10 mg by mouth daily. 08/22/19  Yes Weiland Tomich, Eilleen KempfMiguel Jose, MD  etonogestrel (IMPLANON) 68 MG IMPL implant Inject 1 each into the skin once.   Yes [provider]  GARLIC OIL PO Take by mouth daily.   Yes [provider]  Multiple Vitamin (MULTI VITAMIN DAILY PO) Take by mouth.   Yes [provider]  Omega-3 Fatty Acids (FISH OIL PO) Take by mouth daily.   Yes [provider]  OVER THE COUNTER  MEDICATION    Yes [provider]  TURMERIC-GINGER PO Take 2,360 mg by mouth daily.   Yes [provider]  VITAMIN K PO Take 200 mcg by mouth daily.   Yes [provider]  zolpidem (AMBIEN) 10 MG tablet TAKE 1 TABLET BY MOUTH EVERY DAY AT BEDTIME AS NEEDED FOR SLEEP 03/18/20  Yes Shauniece Kwan, Eilleen KempfMiguel Jose, MD  losartan-hydrochlorothiazide (HYZAAR) 100-25 MG tablet Take 1 tablet by mouth daily. 08/22/19 11/20/19  Georgina QuintSagardia, Pecola Haxton Jose, MD    No Known Allergies  Patient Active Problem List   Diagnosis Date Noted  . Dyslipidemia 08/11/2018  . Onychomycosis 08/11/2018  . Morbid obesity with BMI of 40.0-44.9, adult (HCC) 08/11/2018  . Dyslipidemia associated with type 2 diabetes mellitus (HCC) 09/30/2017  . Poor sleep pattern 09/30/2017  . Type 2 diabetes mellitus without complication, without long-term current use of insulin (HCC) 06/25/2017  . Hypertension associated with diabetes (HCC) 03/22/2007    Past Medical History:  Diagnosis Date  . Allergy    seasonal  . Diabetes mellitus without complication (HCC)   . Hyperlipidemia   . Hypertension     Past Surgical History:  Procedure Laterality Date  . TONSILLECTOMY    . WISDOM TOOTH EXTRACTION      Social History   Socioeconomic History  . Marital status: Married    Spouse name: Not on file  . Number of  children: Not on file  . Years of education: Not on file  . Highest education level: Not on file  Occupational History  . Not on file  Tobacco Use  . Smoking status: Current Every Day Smoker    Packs/day: 0.50    Last attempt to quit: 08/23/2003    Years since quitting: 16.6  . Smokeless tobacco: Never Used  Substance and Sexual Activity  . Alcohol use: Yes    Alcohol/week: 0.0 standard drinks    Comment: occasionally  . Drug use: No  . Sexual activity: Yes    Birth control/protection: Implant  Other Topics Concern  . Not on file  Social History Narrative  . Not on file   Social Determinants  of Health   Financial Resource Strain:   . Difficulty of Paying Living Expenses: Not on file  Food Insecurity:   . Worried About Programme researcher, broadcasting/film/video in the Last Year: Not on file  . Ran Out of Food in the Last Year: Not on file  Transportation Needs:   . Lack of Transportation (Medical): Not on file  . Lack of Transportation (Non-Medical): Not on file  Physical Activity:   . Days of Exercise per Week: Not on file  . Minutes of Exercise per Session: Not on file  Stress:   . Feeling of Stress : Not on file  Social Connections:   . Frequency of Communication with Friends and Family: Not on file  . Frequency of Social Gatherings with Friends and Family: Not on file  . Attends Religious Services: Not on file  . Active Member of Clubs or Organizations: Not on file  . Attends Banker Meetings: Not on file  . Marital Status: Not on file  Intimate Partner Violence:   . Fear of Current or Ex-Partner: Not on file  . Emotionally Abused: Not on file  . Physically Abused: Not on file  . Sexually Abused: Not on file    Family History  Problem Relation Age of Onset  . Diabetes Maternal Grandmother   . Colon cancer Mother      Review of Systems  Constitutional: Negative.  Negative for chills and fever.  HENT: Negative.  Negative for congestion and sore throat.   Respiratory: Negative.  Negative for cough and shortness of breath.   Cardiovascular: Negative.  Negative for chest pain and palpitations.  Gastrointestinal: Negative.  Negative for abdominal pain, diarrhea, nausea and vomiting.  Genitourinary: Negative.  Negative for dysuria and hematuria.  Musculoskeletal: Positive for joint pain (Right elbow pain).  Skin: Negative.  Negative for rash.  Neurological: Negative.  Negative for dizziness and headaches.  All other systems reviewed and are negative.   Today's Vitals   04/11/20 0949  BP: (!) 148/80  Pulse: (!) 115  Resp: 16  Temp: 98.5 F (36.9 C)  TempSrc:  Temporal  SpO2: 95%  Weight: 262 lb 6.4 oz (119 kg)  Height:  (1.727 m)   Body mass index is 39.9 kg/m. Wt Readings from Last 3 Encounters:  04/11/20 262 lb 6.4 oz (119 kg)  08/22/19 270 lb (122.5 kg)  02/16/19 268 lb 6.4 oz (121.7 kg)    Physical Exam Vitals reviewed.  Constitutional:      Appearance: Normal appearance.  HENT:     Head: Normocephalic.  Eyes:     Extraocular Movements: Extraocular movements intact.     Pupils: Pupils are equal, round, and reactive to light.  Cardiovascular:     Rate and  Rhythm: Normal rate and regular rhythm.     Pulses: Normal pulses.     Heart sounds: Normal heart sounds.  Pulmonary:     Effort: Pulmonary effort is normal.     Breath sounds: Normal breath sounds.  Musculoskeletal:        General: Normal range of motion.     Cervical back: Normal range of motion and neck supple.     Comments: Right elbow: Full range of motion.  No erythema or swelling.  No significant tenderness detected.  Skin:    General: Skin is warm and dry.     Capillary Refill: Capillary refill takes less than 2 seconds.  Neurological:     General: No focal deficit present.     Mental Status: She is alert and oriented to person, place, and time.  Psychiatric:        Mood and Affect: Mood normal.        Behavior: Behavior normal.    Results for orders placed or performed in visit on 04/11/20 (from the past 24 hour(s))  POCT glucose (manual entry)     Status: Abnormal   Collection Time: 04/11/20 10:06 AM  Result Value Ref Range   POC Glucose 144 (A) 70 - 99 mg/dl  POCT glycosylated hemoglobin (Hb A1C)     Status: Abnormal   Collection Time: 04/11/20 10:12 AM  Result Value Ref Range   Hemoglobin A1C 7.4 (A) 4.0 - 5.6 %   HbA1c POC (<> result, manual entry)     HbA1c, POC (prediabetic range)     HbA1c, POC (controlled diabetic range)       ASSESSMENT & PLAN: Hypertension associated with diabetes (HCC) Well-controlled hypertension with normal blood  pressure readings at home. Continue present medication at home with amlodipine and Hyzaar. Diabetes improving with hemoglobin A1c at 7.4. Continue Trulicity and [place]. Intolerant to Metformin and glipizide. Continue statin therapy with atorvastatin 40 mg daily. Diet and nutrition discussed. Follow-up in 3 months.  [name] was seen today for diabetes and medication refill.  Diagnoses and all orders for this visit:  Type 2 diabetes mellitus with hyperglycemia, without long-term current use of insulin (HCC) -     POCT glucose (manual entry) -     POCT glycosylated hemoglobin (Hb A1C)  Hypertension associated with diabetes (HCC) -     CBC with Differential/Platelet -     Comprehensive metabolic panel -     Lipid panel -     Microalbumin, urine -     dapagliflozin propanediol (FARXIGA) 10 MG TABS tablet; Take 1 tablet (10 mg total) by mouth daily. -     losartan-hydrochlorothiazide (HYZAAR) 100-25 MG tablet; Take 1 tablet by mouth daily.  Dyslipidemia associated with type 2 diabetes mellitus (HCC) -     atorvastatin (LIPITOR) 40 MG tablet; Take 1 tablet (40 mg total) by mouth daily.  BMI 39.0-39.9,adult  Morbid obesity with BMI of 40.0-44.9, adult (HCC)  Uncontrolled hypertension -     amLODipine (NORVASC) 5 MG tablet; Take 1 tablet (5 mg total) by mouth daily.    Patient Instructions       If you have lab work done today you will be contacted with your lab results within the next 2 weeks.  If you have not heard from [place] then please contact us. The fastest way to get your results is to register for My Chart.   IF you received an x-ray today, you will receive an invoice from [facility]. Please contact  [facility] at [phone] with questions or concerns regarding your invoice.   IF you received labwork today, you will receive an invoice from [facility]. Please contact [facility] at [phone] with questions or concerns regarding your invoice.   Our  billing staff will not be able to assist you with questions regarding bills from these companies.  You will be contacted with the lab results as soon as they are available. The fastest way to get your results is to activate your My Chart account. Instructions are located on the last page of this paperwork. If you have not heard from [place] regarding the results in 2 weeks, please contact this office.     Diabetes Mellitus and Nutrition, Adult When you have diabetes (diabetes mellitus), it is very important to have healthy eating habits because your blood sugar (glucose) levels are greatly affected by what you eat and drink. Eating healthy foods in the appropriate amounts, at about the same times every day, can help you:  Control your blood glucose.  Lower your risk of heart disease.  Improve your blood pressure.  Reach or maintain a healthy weight. Every person with diabetes is different, and each person has different needs for a meal plan. Your health care provider may recommend that you work with a diet and nutrition specialist (dietitian) to make a meal plan that is best for you. Your meal plan may vary depending on factors such as:  The calories you need.  The medicines you take.  Your weight.  Your blood glucose, blood pressure, and cholesterol levels.  Your activity level.  Other health conditions you have, such as heart or kidney disease. How do carbohydrates affect me? Carbohydrates, also called carbs, affect your blood glucose level more than any other type of food. Eating carbs naturally raises the amount of glucose in your blood. Carb counting is a method for keeping track of how many carbs you eat. Counting carbs is important to keep your blood glucose at a healthy level, especially if you use insulin or take certain oral diabetes medicines. It is important to know how many carbs you can safely have in each meal. This is different for every person. Your dietitian can help you  calculate how many carbs you should have at each meal and for each snack. Foods that contain carbs include:  Bread, cereal, rice, pasta, and crackers.  Potatoes and corn.  Peas, beans, and lentils.  Milk and yogurt.  Fruit and juice.  Desserts, such as cakes, cookies, ice cream, and candy. How does alcohol affect me? Alcohol can cause a sudden decrease in blood glucose (hypoglycemia), especially if you use insulin or take certain oral diabetes medicines. Hypoglycemia can be a life-threatening condition. Symptoms of hypoglycemia (sleepiness, dizziness, and confusion) are similar to symptoms of having too much alcohol. If your health care provider says that alcohol is safe for you, follow these guidelines:  Limit alcohol intake to no more than 1 drink per day for nonpregnant women and 2 drinks per day for men. One drink equals 12 oz of beer, 5 oz of wine, or 1 oz of hard liquor.  Do not drink on an empty stomach.  Keep yourself hydrated with water, diet soda, or unsweetened iced tea.  Keep in mind that regular soda, juice, and other mixers may contain a lot of sugar and must be counted as carbs. What are tips for following this plan?  Reading food labels  Start by checking the serving size on the "Nutrition Facts"  label of packaged foods and drinks. The amount of calories, carbs, fats, and other nutrients listed on the label is based on one serving of the item. Many items contain more than one serving per package.  Check the total grams (g) of carbs in one serving. You can calculate the number of servings of carbs in one serving by dividing the total carbs by 15. For example, if a food has 30 g of total carbs, it would be equal to 2 servings of carbs.  Check the number of grams (g) of saturated and trans fats in one serving. Choose foods that have low or no amount of these fats.  Check the number of milligrams (mg) of salt (sodium) in one serving. Most people should limit total  sodium intake to less than 2,300 mg per day.  Always check the nutrition information of foods labeled as "low-fat" or "nonfat". These foods may be higher in added sugar or refined carbs and should be avoided.  Talk to your dietitian to identify your daily goals for nutrients listed on the label. Shopping  Avoid buying canned, premade, or processed foods. These foods tend to be high in fat, sodium, and added sugar.  Shop around the outside edge of the grocery store. This includes fresh fruits and vegetables, bulk grains, fresh meats, and fresh dairy. Cooking  Use low-heat cooking methods, such as baking, instead of high-heat cooking methods like deep frying.  Cook using healthy oils, such as olive, canola, or sunflower oil.  Avoid cooking with butter, cream, or high-fat meats. Meal planning  Eat meals and snacks regularly, preferably at the same times every day. Avoid going long periods of time without eating.  Eat foods high in fiber, such as fresh fruits, vegetables, beans, and whole grains. Talk to your dietitian about how many servings of carbs you can eat at each meal.  Eat 4-6 ounces (oz) of lean protein each day, such as lean meat, chicken, fish, eggs, or tofu. One oz of lean protein is equal to: ? 1 oz of meat, chicken, or fish. ? 1 egg. ?  cup of tofu.  Eat some foods each day that contain healthy fats, such as avocado, nuts, seeds, and fish. Lifestyle  Check your blood glucose regularly.  Exercise regularly as told by your health care provider. This may include: ? 150 minutes of moderate-intensity or vigorous-intensity exercise each week. This could be brisk walking, biking, or water aerobics. ? Stretching and doing strength exercises, such as yoga or weightlifting, at least 2 times a week.  Take medicines as told by your health care provider.  Do not use any products that contain nicotine or tobacco, such as cigarettes and e-cigarettes. If you need help quitting, ask  your health care provider.  Work with a Veterinary surgeon or diabetes educator to identify strategies to manage stress and any emotional and social challenges. Questions to ask a health care provider  Do I need to meet with a diabetes educator?  Do I need to meet with a dietitian?  What number can I call if I have questions?  When are the best times to check my blood glucose? Where to find more information:  American Diabetes Association: diabetes.org  Academy of Nutrition and Dietetics: www.eatright.[employer] of Diabetes and Digestive and Kidney Diseases (NIH): CarFlippers.tn Summary  A healthy meal plan will help you control your blood glucose and maintain a healthy lifestyle.  Working with a diet and nutrition specialist (dietitian) can help you make  a meal plan that is best for you.  Keep in mind that carbohydrates (carbs) and alcohol have immediate effects on your blood glucose levels. It is important to count carbs and to use alcohol carefully. This information is not intended to replace advice given to you by your health care provider. Make sure you discuss any questions you have with your health care provider. Document Revised: 04/23/2017 Document Reviewed: 06/15/2016 Elsevier Patient Education  2020 Elsevier Inc.      Edwina Barth, MD [facility]

## 2020-04-12 LAB — MICROALBUMIN, URINE: Microalbumin, Urine: 207.8 ug/mL

## 2020-04-27 ENCOUNTER — Other Ambulatory Visit: Payer: Self-pay | Admitting: Emergency Medicine

## 2020-04-27 DIAGNOSIS — G4700 Insomnia, unspecified: Secondary | ICD-10-CM

## 2020-04-27 NOTE — Telephone Encounter (Signed)
Requested medications are due for refill today yes  Requested medications are on the active medication list yes  Last refill 10/25  Last visit 11/25  Future visit scheduled Feb 2022  Notes to clinic Not Delegated

## 2020-04-29 ENCOUNTER — Telehealth: Payer: Self-pay | Admitting: *Deleted

## 2020-04-29 NOTE — Telephone Encounter (Signed)
Faxed Rx request for Ambien 10 mg to [facility] pharmacy. Confirmation page 2:51 pm.

## 2020-07-17 ENCOUNTER — Ambulatory Visit: Payer: 59 | Admitting: Emergency Medicine

## 2020-10-04 ENCOUNTER — Other Ambulatory Visit: Payer: Self-pay | Admitting: Emergency Medicine

## 2020-10-04 DIAGNOSIS — E1159 Type 2 diabetes mellitus with other circulatory complications: Secondary | ICD-10-CM

## 2020-10-23 DEATH — deceased
